# Patient Record
Sex: Female | Born: 1980 | Race: Black or African American | Hispanic: No | Marital: Single | Smoking: Never smoker
Health system: Southern US, Community
[De-identification: ages and names within clinical notes are randomized; demographics above are authoritative.]

## PROBLEM LIST (undated history)

## (undated) DIAGNOSIS — B009 Herpesviral infection, unspecified: Secondary | ICD-10-CM

## (undated) DIAGNOSIS — T7840XA Allergy, unspecified, initial encounter: Secondary | ICD-10-CM

## (undated) DIAGNOSIS — B999 Unspecified infectious disease: Secondary | ICD-10-CM

## (undated) DIAGNOSIS — D219 Benign neoplasm of connective and other soft tissue, unspecified: Secondary | ICD-10-CM

## (undated) HISTORY — DX: Herpesviral infection, unspecified: B00.9

## (undated) HISTORY — DX: Allergy, unspecified, initial encounter: T78.40XA

---
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
## 2001-03-24 ENCOUNTER — Emergency Department (HOSPITAL_COMMUNITY): Admission: EM | Admit: 2001-03-24 | Discharge: 2001-03-24 | Payer: Self-pay | Admitting: *Deleted

## 2001-03-24 ENCOUNTER — Encounter: Payer: Self-pay | Admitting: *Deleted

## 2002-01-02 ENCOUNTER — Encounter: Admission: RE | Admit: 2002-01-02 | Discharge: 2002-01-02 | Payer: Self-pay | Admitting: Internal Medicine

## 2007-06-09 ENCOUNTER — Inpatient Hospital Stay (HOSPITAL_COMMUNITY): Admission: AD | Admit: 2007-06-09 | Discharge: 2007-06-09 | Payer: Self-pay | Admitting: Obstetrics and Gynecology

## 2007-06-16 ENCOUNTER — Ambulatory Visit (HOSPITAL_COMMUNITY): Admission: RE | Admit: 2007-06-16 | Discharge: 2007-06-16 | Payer: Self-pay | Admitting: Family Medicine

## 2007-07-14 ENCOUNTER — Ambulatory Visit (HOSPITAL_COMMUNITY): Admission: RE | Admit: 2007-07-14 | Discharge: 2007-07-14 | Payer: Self-pay | Admitting: Family Medicine

## 2007-07-22 ENCOUNTER — Ambulatory Visit (HOSPITAL_COMMUNITY): Admission: RE | Admit: 2007-07-22 | Discharge: 2007-07-22 | Payer: Self-pay | Admitting: Obstetrics & Gynecology

## 2007-09-01 ENCOUNTER — Ambulatory Visit (HOSPITAL_COMMUNITY): Admission: RE | Admit: 2007-09-01 | Discharge: 2007-09-01 | Payer: Self-pay | Admitting: Obstetrics & Gynecology

## 2007-10-06 ENCOUNTER — Ambulatory Visit (HOSPITAL_COMMUNITY): Admission: RE | Admit: 2007-10-06 | Discharge: 2007-10-06 | Payer: Self-pay | Admitting: Obstetrics & Gynecology

## 2007-11-08 ENCOUNTER — Ambulatory Visit (HOSPITAL_COMMUNITY): Admission: RE | Admit: 2007-11-08 | Discharge: 2007-11-08 | Payer: Self-pay | Admitting: Family Medicine

## 2007-12-08 ENCOUNTER — Ambulatory Visit (HOSPITAL_COMMUNITY): Admission: RE | Admit: 2007-12-08 | Discharge: 2007-12-08 | Payer: Self-pay | Admitting: Obstetrics & Gynecology

## 2007-12-15 ENCOUNTER — Inpatient Hospital Stay (HOSPITAL_COMMUNITY): Admission: AD | Admit: 2007-12-15 | Discharge: 2007-12-19 | Payer: Self-pay | Admitting: Obstetrics & Gynecology

## 2007-12-15 ENCOUNTER — Ambulatory Visit: Payer: Self-pay | Admitting: Physician Assistant

## 2007-12-20 ENCOUNTER — Inpatient Hospital Stay (HOSPITAL_COMMUNITY): Admission: AD | Admit: 2007-12-20 | Discharge: 2007-12-20 | Payer: Self-pay | Admitting: Obstetrics & Gynecology

## 2007-12-20 ENCOUNTER — Ambulatory Visit: Payer: Self-pay | Admitting: Family Medicine

## 2008-02-09 ENCOUNTER — Encounter: Admission: RE | Admit: 2008-02-09 | Discharge: 2008-02-09 | Payer: Self-pay | Admitting: Family Medicine

## 2008-02-17 ENCOUNTER — Encounter: Admission: RE | Admit: 2008-02-17 | Discharge: 2008-02-17 | Payer: Self-pay | Admitting: General Practice

## 2010-03-23 ENCOUNTER — Encounter: Payer: Self-pay | Admitting: Family Medicine

## 2010-03-24 ENCOUNTER — Encounter: Payer: Self-pay | Admitting: Family Medicine

## 2010-07-18 NOTE — Discharge Summary (Signed)
Brittney Mcbride, Brittney Mcbride                ACCOUNT NO.:  [id number]   MEDICAL RECORD NO.:  [id number]          PATIENT TYPE:  INP   LOCATION:  9320                          FACILITY:  [facility]   PHYSICIAN:  Lesly Dukes, M.D. DATE OF BIRTH:  June 16, 1980   DATE OF ADMISSION:  12/15/2007  DATE OF DISCHARGE:  12/19/2007                               DISCHARGE SUMMARY   REASON FOR ADMISSION:  Pregnancy at 38 weeks and 5 days.   Admitted for induction of labor for oligohydramnios.  The patient was  admitted on December 15, 2007, and Foley bulb was placed at that time due  to an unfavorable cervix.  She labored slowly during this course, got  into an active phase of labor actually on December 16, 2007, delivered  vaginally on December 17, 2007, a viable female without any complications.  She progressed postpartum without any complication and was discharged  home on December 19, 2007, without any problem.      Zerita Boers, N.M.      Lesly Dukes, M.D.  Electronically Signed    DL/MEDQ  D:  16/12/9602  T:  01/10/2008  Job:  [id number]

## 2010-08-14 ENCOUNTER — Inpatient Hospital Stay (HOSPITAL_COMMUNITY): Payer: Medicaid Other

## 2010-08-14 ENCOUNTER — Inpatient Hospital Stay (HOSPITAL_COMMUNITY)
Admission: AD | Admit: 2010-08-14 | Discharge: 2010-08-14 | Disposition: A | Discharge status: Home / Self Care | Payer: Medicaid Other | Source: Ambulatory Visit | Attending: Family Medicine | Admitting: Family Medicine

## 2010-08-14 DIAGNOSIS — R1032 Left lower quadrant pain: Secondary | ICD-10-CM

## 2010-08-14 DIAGNOSIS — N390 Urinary tract infection, site not specified: Secondary | ICD-10-CM | POA: Insufficient documentation

## 2010-08-14 DIAGNOSIS — N912 Amenorrhea, unspecified: Secondary | ICD-10-CM | POA: Insufficient documentation

## 2010-08-14 DIAGNOSIS — D259 Leiomyoma of uterus, unspecified: Secondary | ICD-10-CM | POA: Insufficient documentation

## 2010-08-14 LAB — URINE MICROSCOPIC-ADD ON

## 2010-08-14 LAB — URINALYSIS, ROUTINE W REFLEX MICROSCOPIC
Glucose, UA: NEGATIVE mg/dL
Specific Gravity, Urine: 1.025 (ref 1.005–1.030)
pH: 6 (ref 5.0–8.0)

## 2010-08-14 LAB — CBC
HCT: 36.9 % (ref 36.0–46.0)
MCHC: 32 g/dL (ref 30.0–36.0)
MCV: 79.2 fL (ref 78.0–100.0)
RDW: 15.6 % — ABNORMAL HIGH (ref 11.5–15.5)
WBC: 7.2 10*3/uL (ref 4.0–10.5)

## 2010-08-14 LAB — WET PREP, GENITAL: Yeast Wet Prep HPF POC: NONE SEEN

## 2010-08-15 LAB — TSH: TSH: 3.129 u[IU]/mL (ref 0.350–4.500)

## 2010-08-15 LAB — GC/CHLAMYDIA PROBE AMP, GENITAL: Chlamydia, DNA Probe: NEGATIVE

## 2010-11-25 LAB — URINALYSIS, ROUTINE W REFLEX MICROSCOPIC
Bilirubin Urine: NEGATIVE
Nitrite: NEGATIVE
Specific Gravity, Urine: 1.01
pH: 7

## 2010-11-25 LAB — URINE MICROSCOPIC-ADD ON

## 2010-11-25 LAB — URINE CULTURE

## 2010-12-01 LAB — CBC
HCT: 19.5 — ABNORMAL LOW
HCT: 21.5 — ABNORMAL LOW
HCT: 27.4 — ABNORMAL LOW
Hemoglobin: 6.1 — CL
Hemoglobin: 6.7 — CL
Hemoglobin: 8.4 — ABNORMAL LOW
MCHC: 31.1
MCV: 74 — ABNORMAL LOW
MCV: 74.4 — ABNORMAL LOW
Platelets: 158
RBC: 2.61 — ABNORMAL LOW
RBC: 2.9 — ABNORMAL LOW
WBC: 15.2 — ABNORMAL HIGH
WBC: 8.4

## 2012-04-11 ENCOUNTER — Encounter: Payer: Self-pay | Admitting: Family Medicine

## 2012-04-11 ENCOUNTER — Other Ambulatory Visit: Payer: Self-pay | Admitting: Family Medicine

## 2012-04-11 ENCOUNTER — Ambulatory Visit (INDEPENDENT_AMBULATORY_CARE_PROVIDER_SITE_OTHER): Payer: Medicaid Other | Admitting: Family Medicine

## 2012-04-11 VITALS — BP 127/89 | HR 67 | Temp 99.0°F | Ht 65.5 in | Wt 311.0 lb

## 2012-04-11 DIAGNOSIS — R109 Unspecified abdominal pain: Secondary | ICD-10-CM | POA: Insufficient documentation

## 2012-04-11 DIAGNOSIS — A6000 Herpesviral infection of urogenital system, unspecified: Secondary | ICD-10-CM | POA: Insufficient documentation

## 2012-04-11 DIAGNOSIS — R739 Hyperglycemia, unspecified: Secondary | ICD-10-CM | POA: Insufficient documentation

## 2012-04-11 HISTORY — DX: Morbid (severe) obesity due to excess calories: E66.01

## 2012-04-11 HISTORY — DX: Herpesviral infection of urogenital system, unspecified: A60.00

## 2012-04-11 LAB — COMPREHENSIVE METABOLIC PANEL
Albumin: 4.5 g/dL (ref 3.5–5.2)
BUN: 8 mg/dL (ref 6–23)
CO2: 31 mEq/L (ref 19–32)
Glucose, Bld: 108 mg/dL — ABNORMAL HIGH (ref 70–99)
Sodium: 140 mEq/L (ref 135–145)
Total Bilirubin: 0.6 mg/dL (ref 0.3–1.2)
Total Protein: 7.3 g/dL (ref 6.0–8.3)

## 2012-04-11 LAB — CBC WITH DIFFERENTIAL/PLATELET
Basophils Relative: 1 % (ref 0–1)
Eosinophils Relative: 5 % (ref 0–5)
HCT: 36.7 % (ref 36.0–46.0)
Hemoglobin: 11.7 g/dL — ABNORMAL LOW (ref 12.0–15.0)
MCH: 25.2 pg — ABNORMAL LOW (ref 26.0–34.0)
MCHC: 31.9 g/dL (ref 30.0–36.0)
MCV: 78.9 fL (ref 78.0–100.0)
Monocytes Absolute: 0.4 10*3/uL (ref 0.1–1.0)
Monocytes Relative: 6 % (ref 3–12)
Neutro Abs: 2.6 10*3/uL (ref 1.7–7.7)
RDW: 15.2 % (ref 11.5–15.5)

## 2012-04-11 NOTE — Assessment & Plan Note (Addendum)
-  Emberly has a strong desire to be healthy; and is ready to start losing weight with some help. -We have begun setting goals today of 2 pounds a week loss, for the next 6 weeks; achieved by diet plan of 20 minutes x3w; with heart range goal up to 120/m. -Diet and healthy options have been discussed and will be discussed in more detail on next visit.  - AVS given on safe exercise and additional information on healthy food choices.  -Target weight for next appointment is 300.  - F/U: 6 weeks

## 2012-04-11 NOTE — Assessment & Plan Note (Addendum)
-   Currently on Valtrex 500 mg during outbreaks only, per Gyn, with many refills.  - Discussed suppression therapy and benefits vs. Risks - Patient is sexually active with condom use for protection. However, she does desire more children.  - Willing to prescribe Valtrex if needed

## 2012-04-11 NOTE — Patient Instructions (Signed)
It was a pleasure meeting you today. As a team, together we help you loose weight in a healthy manner with diet and exercise.   Exercise to Lose Weight Exercise and a healthy diet may help you lose weight. Your doctor may suggest specific exercises. EXERCISE IDEAS AND TIPS  Choose low-cost things you enjoy doing, such as walking, bicycling, or exercising to workout videos.  Take stairs instead of the elevator.  Walk during your lunch break.  Park your car further away from work or school.  Go to a gym or an exercise class.  Start with 5 to 10 minutes of exercise each day. Build up to 30 minutes of exercise 4 to 6 days a week.  Wear shoes with good support and comfortable clothes.  Stretch before and after working out.  Work out until you breathe harder and your heart beats faster.  Drink extra water when you exercise.  Do not do so much that you hurt yourself, feel dizzy, or get very short of breath. Exercises that burn about 150 calories:  Running 1  miles in 15 minutes.  Playing volleyball for 45 to 60 minutes.  Washing and waxing a car for 45 to 60 minutes.  Playing touch football for 45 minutes.  Walking 1  miles in 35 minutes.  Pushing a stroller 1  miles in 30 minutes.  Playing basketball for 30 minutes.  Raking leaves for 30 minutes.  Bicycling 5 miles in 30 minutes.  Walking 2 miles in 30 minutes.  Dancing for 30 minutes.  Shoveling snow for 15 minutes.  Swimming laps for 20 minutes.  Walking up stairs for 15 minutes.  Bicycling 4 miles in 15 minutes.  Gardening for 30 to 45 minutes.  Jumping rope for 15 minutes.  Washing windows or floors for 45 to 60 minutes. Document Released: 03/21/2010 Document Revised: 05/11/2011 Document Reviewed: 03/21/2010 [facility] Patient Information 2013 [place], [place].

## 2012-04-11 NOTE — Progress Notes (Signed)
Subjective:     Patient ID: Brittney Mcbride, female   DOB: Jun 13, 1980, 32 y.o.   MRN: [id number]  HPI New patient: Patient is here to establish care, by suggestion of her GYN, after an elevated lipid profile. Medications, allergies, PMH, surgical, social and family history.    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 She currently takes Valtrex for outbreaks only. She reports she has less than two outbreaks a year. Her gynecologists has prescribed her with refills. She has no current outbreak. She currently is not on birth control medication. She endorses she practices safe sex with condoms. She desires more children. LMP: 03/31/2012. Cycle every 32 days, tracks is phone app, 4-6d in length, heavy on 2-3d. Admits to severe abdominal pain and cramping during her menstrual cycle.  NSVD viable female @ 39w (unknown year).  Weight Loss:  Patients main concern today is weight loss. She reports she wants to lose weight and has started, by attempting to walk more often and choosing healthier food options. She wants to be a better example for school age son. She reports she lost about 30 pounds since last year this time. She reports mild lower back pain she feels is from her weight. She reports diabetes runs her family and she feel she is experiencing signs of possible diabetes. She feels she can loose weight with some help.   Review of Systems  Constitutional: Negative for fever and chills.  Endocrine: Positive for polydipsia and polyuria.  Genitourinary: Negative for urgency, genital sores and pelvic pain.       Objective:   Physical Exam  Nursing note and vitals reviewed. Constitutional: She is oriented to person, place, and time. She appears well-developed and well-nourished. No distress.  Obese. Extremely pleasant.  HENT:  Head: Normocephalic and atraumatic.  Right Ear: External ear normal.  Left Ear: External ear normal.  Nose: Nose normal.   Mouth/Throat: Oropharynx is clear and moist. No oropharyngeal exudate.  Eyes: Conjunctivae and EOM are normal. Pupils are equal, round, and reactive to light. Right eye exhibits no discharge. Left eye exhibits no discharge. No scleral icterus.  Neck: Normal range of motion. Neck supple. No thyromegaly present.  Cardiovascular: Normal rate and regular rhythm.   Pulmonary/Chest: Effort normal and breath sounds normal.  Abdominal: Soft. Bowel sounds are normal. She exhibits no mass. There is no hepatosplenomegaly. There is tenderness in the left upper quadrant. There is no rigidity, no rebound, no guarding, no CVA tenderness, no tenderness at McBurney's point and negative Murphy's sign.  Obese.  Musculoskeletal: She exhibits no edema and no tenderness.  Lymphadenopathy:    She has no cervical adenopathy.  Neurological: She is alert and oriented to person, place, and time. She has normal reflexes.  Skin: Skin is warm and dry.  Psychiatric: She has a normal mood and affect.    BP 127/89  Pulse 67  Temp(Src) 99 F (37.2 C) (Oral)  Ht 5' 5.5" (1.664 m)  Wt 311 lb (141.069 kg)  BMI 50.95 kg/m2  LMP 03/31/2012

## 2012-04-11 NOTE — Assessment & Plan Note (Signed)
-   Obese patient with an elevated glucose on CBC, with symptoms of polydipsia and polyuria. - Future order of UA, FBG, and A1c added to lipid profile future order. - lifestyle changes are being implemented - F/U: pending results

## 2012-04-11 NOTE — Assessment & Plan Note (Signed)
-   On exam patient: TTP LUQ--> suprapubic. Otherwise, no other complaints.   - Baseline labs completed today CBC, CMP, TSH; Lipid profile (fasting) placed for future order to be completed within two weeks.   - Etiology, nonspecific. Her son was sent home today from school with GI complaints.   -  Possible viral etiology. Instructed to follow-up in one week if pain worsens, changes in GI pattern or fever. - Considering possible imaging studies if condition worsens.

## 2012-04-19 ENCOUNTER — Other Ambulatory Visit: Payer: Medicaid Other

## 2012-04-19 LAB — LIPID PANEL
Cholesterol: 137 mg/dL (ref 0–200)
HDL: 31 mg/dL — ABNORMAL LOW (ref >39)
Triglycerides: 178 mg/dL — ABNORMAL HIGH (ref <150)
VLDL: 36 mg/dL (ref 0–40)

## 2012-04-19 NOTE — Progress Notes (Signed)
FLP DONE TODAY Brittney Mcbride 

## 2012-04-25 ENCOUNTER — Encounter: Payer: Self-pay | Admitting: Family Medicine

## 2012-06-28 ENCOUNTER — Encounter: Payer: Self-pay | Admitting: Obstetrics

## 2012-06-29 ENCOUNTER — Ambulatory Visit (INDEPENDENT_AMBULATORY_CARE_PROVIDER_SITE_OTHER): Payer: Medicaid Other | Admitting: Obstetrics

## 2012-06-29 ENCOUNTER — Encounter: Payer: Self-pay | Admitting: Obstetrics

## 2012-06-29 ENCOUNTER — Ambulatory Visit (HOSPITAL_COMMUNITY): Admission: RE | Admit: 2012-06-29 | Payer: Medicaid Other | Source: Ambulatory Visit

## 2012-06-29 VITALS — BP 139/98 | HR 71 | Temp 98.7°F

## 2012-06-29 DIAGNOSIS — Z3202 Encounter for pregnancy test, result negative: Secondary | ICD-10-CM

## 2012-06-29 DIAGNOSIS — N946 Dysmenorrhea, unspecified: Secondary | ICD-10-CM

## 2012-06-29 DIAGNOSIS — D259 Leiomyoma of uterus, unspecified: Secondary | ICD-10-CM

## 2012-06-29 DIAGNOSIS — N920 Excessive and frequent menstruation with regular cycle: Secondary | ICD-10-CM

## 2012-06-29 LAB — COMPREHENSIVE METABOLIC PANEL
BUN: 5 mg/dL — ABNORMAL LOW (ref 6–23)
CO2: 28 mEq/L (ref 19–32)
Creat: 0.97 mg/dL (ref 0.50–1.10)
Glucose, Bld: 87 mg/dL (ref 70–99)
Total Bilirubin: 0.6 mg/dL (ref 0.3–1.2)
Total Protein: 7.3 g/dL (ref 6.0–8.3)

## 2012-06-29 LAB — DIFFERENTIAL
Eosinophils Absolute: 0.4 10*3/uL (ref 0.0–0.7)
Eosinophils Relative: 6 % — ABNORMAL HIGH (ref 0–5)
Lymphs Abs: 3 10*3/uL (ref 0.7–4.0)
Monocytes Relative: 5 % (ref 3–12)

## 2012-06-29 LAB — CBC
Hemoglobin: 11.3 g/dL — ABNORMAL LOW (ref 12.0–15.0)
MCH: 25.8 pg — ABNORMAL LOW (ref 26.0–34.0)
MCV: 81.3 fL (ref 78.0–100.0)
RBC: 4.38 MIL/uL (ref 3.87–5.11)

## 2012-06-29 NOTE — Patient Instructions (Addendum)
Fibroids

## 2012-06-29 NOTE — Progress Notes (Signed)
.   Subjective:     Brittney Mcbride is a 32 y.o. female here for a problem visit.  Current complaints: severe cramps during menstrual cycle.  Patient would like to discuss a hysterectomy.  Personal health questionnaire reviewed: yes.   Gynecologic History Patient's last menstrual period was 06/26/2012. Contraception: none Last Pap: 03/2012. Results were: normal Last mammogram: N/A   The following portions of the patient's history were reviewed and updated as appropriate: allergies, current medications, past family history, past medical history, past social history, past surgical history and problem list.  Review of Systems Pertinent items are noted in HPI.    Objective:    General appearance: alert and no distress Abdomen: normal findings: soft, non-tender Pelvic: cervix normal in appearance, external genitalia normal, no adnexal masses or tenderness, no cervical motion tenderness, uterus normal size, shape, and consistency and vagina normal without discharge    Assessment:    Healthy female exam.  Severe dysmenorrhea   Plan:    Education reviewed: Dysmenorrhea.   Ultrasound ordered.

## 2012-06-30 ENCOUNTER — Ambulatory Visit (HOSPITAL_COMMUNITY)
Admission: RE | Admit: 2012-06-30 | Discharge: 2012-06-30 | Disposition: A | Discharge status: Home / Self Care | Payer: Medicaid Other | Source: Ambulatory Visit | Attending: Obstetrics | Admitting: Obstetrics

## 2012-06-30 ENCOUNTER — Encounter: Payer: Self-pay | Admitting: Obstetrics

## 2012-06-30 ENCOUNTER — Ambulatory Visit (INDEPENDENT_AMBULATORY_CARE_PROVIDER_SITE_OTHER): Payer: Medicaid Other | Admitting: Obstetrics

## 2012-06-30 ENCOUNTER — Other Ambulatory Visit: Payer: Self-pay | Admitting: Obstetrics

## 2012-06-30 VITALS — BP 140/94 | HR 69 | Temp 97.9°F | Wt 304.0 lb

## 2012-06-30 DIAGNOSIS — R1032 Left lower quadrant pain: Secondary | ICD-10-CM

## 2012-06-30 DIAGNOSIS — D259 Leiomyoma of uterus, unspecified: Secondary | ICD-10-CM

## 2012-06-30 DIAGNOSIS — N938 Other specified abnormal uterine and vaginal bleeding: Secondary | ICD-10-CM | POA: Insufficient documentation

## 2012-06-30 DIAGNOSIS — N946 Dysmenorrhea, unspecified: Secondary | ICD-10-CM

## 2012-06-30 DIAGNOSIS — D250 Submucous leiomyoma of uterus: Secondary | ICD-10-CM | POA: Insufficient documentation

## 2012-06-30 DIAGNOSIS — N949 Unspecified condition associated with female genital organs and menstrual cycle: Secondary | ICD-10-CM | POA: Insufficient documentation

## 2012-06-30 DIAGNOSIS — N920 Excessive and frequent menstruation with regular cycle: Secondary | ICD-10-CM

## 2012-06-30 LAB — TSH: TSH: 1.503 u[IU]/mL (ref 0.350–4.500)

## 2012-06-30 MED ORDER — TRAMADOL HCL 50 MG PO TABS: 50.0000 mg | ORAL_TABLET | Freq: Four times a day (QID) | ORAL | Status: DC | PRN

## 2012-06-30 MED ORDER — PRENATAL PLUS IRON 29-1 MG PO TABS: 1.0000 | ORAL_TABLET | Freq: Every day | ORAL | Status: DC

## 2012-06-30 NOTE — Progress Notes (Signed)
Subjective:     Brittney Mcbride is a 32 y.o. female here for follow up after ultrasound done today.  Current complaints: none.  Personal health questionnaire reviewed: not asked.   Gynecologic History Patient's last menstrual period was 06/26/2012.  The following portions of the patient's history were reviewed and updated as appropriate: allergies, current medications, past family history, past medical history, past social history, past surgical history and problem list.  Review of Systems Pertinent items are noted in HPI.    Objective:     No exam.  Consult only.     Assessment:    Uterine Fibroids  Dysmenorrhea  Menorrhagia      Plan:  Education reviewed: Management of:  Fibroids.                                                               Dysmenorrhea                                                               Menorhagia   F/U in 6 months

## 2012-06-30 NOTE — Patient Instructions (Addendum)
Fibroids Dysmenorrhea Menorrhagia Ablation

## 2012-07-01 ENCOUNTER — Ambulatory Visit (HOSPITAL_COMMUNITY): Payer: Medicaid Other

## 2013-01-11 ENCOUNTER — Ambulatory Visit: Payer: Medicaid Other | Admitting: Obstetrics

## 2013-01-12 ENCOUNTER — Ambulatory Visit (INDEPENDENT_AMBULATORY_CARE_PROVIDER_SITE_OTHER): Payer: Medicaid Other | Admitting: Obstetrics

## 2013-01-12 ENCOUNTER — Encounter: Payer: Self-pay | Admitting: Obstetrics

## 2013-01-12 VITALS — BP 121/86 | HR 65 | Temp 98.8°F | Ht 64.0 in | Wt 297.0 lb

## 2013-01-12 DIAGNOSIS — Z202 Contact with and (suspected) exposure to infections with a predominantly sexual mode of transmission: Secondary | ICD-10-CM | POA: Insufficient documentation

## 2013-01-12 DIAGNOSIS — N760 Acute vaginitis: Secondary | ICD-10-CM | POA: Insufficient documentation

## 2013-01-12 DIAGNOSIS — Z113 Encounter for screening for infections with a predominantly sexual mode of transmission: Secondary | ICD-10-CM

## 2013-01-12 DIAGNOSIS — Z2089 Contact with and (suspected) exposure to other communicable diseases: Secondary | ICD-10-CM

## 2013-01-12 NOTE — Progress Notes (Signed)
Subjective:     Brittney Mcbride is a 32 y.o. female here for a follow up exam.  Current complaints: persistent LLQ pain, worse over the past year.  Personal health questionnaire reviewed: yes.   Gynecologic History Patient's last menstrual period was 01/04/2013. Contraception: none Last Pap: January/2014. Results were: normal Last mammogram: December 2009. Results were: normal  Obstetric History OB History  No data available     The following portions of the patient's history were reviewed and updated as appropriate: allergies, current medications, past family history, past medical history, past social history, past surgical history and problem list.  Review of Systems Pertinent items are noted in HPI.    Objective:    General appearance: alert and no distress Abdomen: normal findings: soft, non-tender Pelvic: cervix normal in appearance, external genitalia normal, no adnexal masses or tenderness, no cervical motion tenderness, rectovaginal septum normal, uterus normal size, shape, and consistency and vagina normal without discharge    Assessment:    Healthy female exam.   H/O possible STD exposure.   Plan:    Education reviewed: safe sex/STD prevention. Follow up in: several months.

## 2013-01-13 LAB — WET PREP BY MOLECULAR PROBE
Candida species: NEGATIVE
Trichomonas vaginosis: NEGATIVE

## 2013-01-13 LAB — GC/CHLAMYDIA PROBE AMP: CT Probe RNA: NEGATIVE

## 2013-01-15 ENCOUNTER — Encounter (HOSPITAL_COMMUNITY): Payer: Self-pay | Admitting: *Deleted

## 2013-01-15 ENCOUNTER — Inpatient Hospital Stay (HOSPITAL_COMMUNITY)
Admission: AD | Admit: 2013-01-15 | Discharge: 2013-01-15 | Disposition: A | Discharge status: Home / Self Care | Payer: Medicaid Other | Source: Ambulatory Visit | Attending: Obstetrics | Admitting: Obstetrics

## 2013-01-15 DIAGNOSIS — N949 Unspecified condition associated with female genital organs and menstrual cycle: Secondary | ICD-10-CM

## 2013-01-15 DIAGNOSIS — B9689 Other specified bacterial agents as the cause of diseases classified elsewhere: Secondary | ICD-10-CM | POA: Insufficient documentation

## 2013-01-15 DIAGNOSIS — R102 Pelvic and perineal pain: Secondary | ICD-10-CM

## 2013-01-15 DIAGNOSIS — N760 Acute vaginitis: Secondary | ICD-10-CM | POA: Insufficient documentation

## 2013-01-15 DIAGNOSIS — A499 Bacterial infection, unspecified: Secondary | ICD-10-CM | POA: Insufficient documentation

## 2013-01-15 HISTORY — DX: Benign neoplasm of connective and other soft tissue, unspecified: D21.9

## 2013-01-15 HISTORY — DX: Unspecified infectious disease: B99.9

## 2013-01-15 LAB — WET PREP, GENITAL: Trich, Wet Prep: NONE SEEN

## 2013-01-15 LAB — URINE MICROSCOPIC-ADD ON

## 2013-01-15 LAB — URINALYSIS, ROUTINE W REFLEX MICROSCOPIC
Bilirubin Urine: NEGATIVE
Specific Gravity, Urine: 1.025 (ref 1.005–1.030)
Urobilinogen, UA: 1 mg/dL (ref 0.0–1.0)

## 2013-01-15 MED ORDER — OXYCODONE-ACETAMINOPHEN 5-325 MG PO TABS: 2.0000 | ORAL_TABLET | ORAL | Status: DC | PRN

## 2013-01-15 MED ORDER — METRONIDAZOLE 500 MG PO TABS: 500.0000 mg | ORAL_TABLET | Freq: Two times a day (BID) | ORAL | Status: DC

## 2013-01-15 MED ORDER — CEPHALEXIN 500 MG PO CAPS: 500.0000 mg | ORAL_CAPSULE | Freq: Two times a day (BID) | ORAL | Status: DC

## 2013-01-15 MED ORDER — OXYCODONE-ACETAMINOPHEN 5-325 MG PO TABS: 2.0000 | ORAL_TABLET | Freq: Once | ORAL | Status: DC

## 2013-01-15 MED ORDER — KETOROLAC TROMETHAMINE 60 MG/2ML IM SOLN
60.0000 mg | Freq: Once | INTRAMUSCULAR | Status: AC
  Administered 2013-01-15: 60 mg via INTRAMUSCULAR
  Filled 2013-01-15: qty 2

## 2013-01-15 NOTE — MAU Note (Signed)
Went to dr Thursday, for 41month checkup, fibroids.  Has been having a lot more discomfort since exam, not normal and pain has gotten worse.. Now saw weird d/c when used restroom

## 2013-01-15 NOTE — MAU Provider Note (Signed)
History     CSN: [id number]  Arrival date and time: 01/15/13 1317   First Provider Initiated Contact with Patient 01/15/13 1407      Chief Complaint  Patient presents with  . vaginal soreness    HPI  Pt is not pregnant and was seen at Dr. Verdell Carmine office on 01/12/2013 Magdalene Molly) for exam.  Pt states after  Her exam with Dr. Clearance Coots, pt started having intense vaginal pain- hurts to sit  Pt took Advil last night without relief; pt also has noticed some clumpy, stringy vaginal discharge. Pt had AFFIRM in office which showed pos Gardnerella but pt has not been notified- GC/Chlamdyia were neg Pt denies dysuria. Pt has hx of HSV but only takes episodically- hasan't taken in several weeks.  This does not feel Like HSV.  Past Medical History  Diagnosis Date  . HSV-2 (herpes simplex virus 2) infection   . Allergy   . Fibroid   . Infection     UTI    Past Surgical History  Procedure Laterality Date  . Wisdom tooth extraction  2000    Family History  Problem Relation Age of Onset  . Diabetes Father   . Asthma Brother   . Diabetes Brother   . Early death Brother   . Drug abuse Maternal Aunt   . Drug abuse Paternal Uncle   . Alcohol abuse Maternal Grandmother   . Asthma Maternal Grandmother   . Alzheimer's disease Maternal Grandmother   . Alcohol abuse Maternal Grandfather   . Asthma Maternal Grandfather   . Alcohol abuse Paternal Grandmother   . Diabetes Paternal Grandmother   . Alcohol abuse Paternal Grandfather     History  Substance Use Topics  . Smoking status: Never Smoker   . Smokeless tobacco: Never Used  . Alcohol Use: No    Allergies:  Allergies  Allergen Reactions  . Latex Itching    Prescriptions prior to admission  Medication Sig Dispense Refill  . ibuprofen (ADVIL,MOTRIN) 800 MG tablet Take 800 mg by mouth every 8 (eight) hours as needed for moderate pain.      . valACYclovir (VALTREX) 500 MG tablet Take 1,000 mg by mouth 2 (two) times daily as  needed (during outbreaks).         Review of Systems  Constitutional: Negative for fever and chills.  Gastrointestinal: Negative for nausea, vomiting, abdominal pain, diarrhea and constipation.  Genitourinary: Negative for dysuria and urgency.   Physical Exam   Blood pressure 126/85, pulse 103, temperature 98.9 F (37.2 C), temperature source Oral, resp. rate 20, height 5\' 5"  (1.651 m), weight 130.182 kg (287 lb), last menstrual period 01/04/2013.  Physical Exam  Nursing note and vitals reviewed. Constitutional: She is oriented to person, place, and time. She appears well-developed and well-nourished. No distress.  HENT:  Head: Normocephalic.  Eyes: Pupils are equal, round, and reactive to light.  Neck: Normal range of motion. Neck supple.  Cardiovascular: Normal rate.   Respiratory: Effort normal.  GI: Soft.  Genitourinary:  Right labia edematous and exquisitely tender to touch-no lesions or fissures noted; speculum and bimanual exam compromised by pt's discomfort  Musculoskeletal: Normal range of motion.  Neurological: She is alert and oriented to person, place, and time.  Skin: Skin is warm and dry.  Psychiatric: She has a normal mood and affect.    MAU Course  Procedures Results for orders placed during the hospital encounter of 01/15/13 (from the past 24 hour(s))  URINALYSIS, ROUTINE W REFLEX MICROSCOPIC  Status: Abnormal   Collection Time    01/15/13  1:35 PM      Result Value Range   Color, Urine YELLOW  YELLOW   APPearance CLOUDY (*) CLEAR   Specific Gravity, Urine 1.025  1.005 - 1.030   pH 8.0  5.0 - 8.0   Glucose, UA NEGATIVE  NEGATIVE mg/dL   Hgb urine dipstick LARGE (*) NEGATIVE   Bilirubin Urine NEGATIVE  NEGATIVE   Ketones, ur NEGATIVE  NEGATIVE mg/dL   Protein, ur 161 (*) NEGATIVE mg/dL   Urobilinogen, UA 1.0  0.0 - 1.0 mg/dL   Nitrite POSITIVE (*) NEGATIVE   Leukocytes, UA MODERATE (*) NEGATIVE  URINE MICROSCOPIC-ADD ON     Status: Abnormal    Collection Time    01/15/13  1:35 PM      Result Value Range   Squamous Epithelial / LPF MANY (*) RARE   WBC, UA TOO NUMEROUS TO COUNT  <3 WBC/hpf   RBC / HPF 11-20  <3 RBC/hpf   Bacteria, UA MANY (*) RARE   Urine-Other MUCOUS PRESENT    WET PREP, GENITAL     Status: Abnormal   Collection Time    01/15/13  2:15 PM      Result Value Range   Yeast Wet Prep HPF POC NONE SEEN  NONE SEEN   Trich, Wet Prep NONE SEEN  NONE SEEN   Clue Cells Wet Prep HPF POC MODERATE (*) NONE SEEN   WBC, Wet Prep HPF POC MODERATE (*) NONE SEEN  POCT PREGNANCY, URINE     Status: None   Collection Time    01/15/13  2:30 PM      Result Value Range   Preg Test, Ur NEGATIVE  NEGATIVE   Discussed with Dr. Gaynell Face- will treat with Keflex with suspicion of Bartholin's  Warm sitz baths and Percocet for pain   Assessment and Plan  Vaginal pain -suspicious for Bartholin's - Keflex 500 mg BID for 10 days Percocet for pain Warm sitz baths BV- Flagyl 500mg  BID for 7 days Recommended pt to take her Valtrex Pt to f/u with Dr. Clearance Coots tomorrow  Pamelia Hoit 01/15/2013, 2:42 PM

## 2013-01-17 LAB — URINE CULTURE: Culture: >=100000

## 2013-01-24 ENCOUNTER — Ambulatory Visit: Payer: Medicaid Other | Admitting: Obstetrics

## 2013-01-25 ENCOUNTER — Encounter: Payer: Self-pay | Admitting: Obstetrics

## 2013-01-25 ENCOUNTER — Ambulatory Visit (INDEPENDENT_AMBULATORY_CARE_PROVIDER_SITE_OTHER): Payer: Medicaid Other | Admitting: Obstetrics

## 2013-01-25 VITALS — BP 114/77 | HR 75 | Temp 98.4°F | Ht 64.0 in | Wt 293.0 lb

## 2013-01-25 DIAGNOSIS — N751 Abscess of Bartholin's gland: Secondary | ICD-10-CM | POA: Insufficient documentation

## 2013-01-25 DIAGNOSIS — N760 Acute vaginitis: Secondary | ICD-10-CM

## 2013-01-25 HISTORY — DX: Abscess of Bartholin's gland: N75.1

## 2013-01-25 NOTE — Progress Notes (Signed)
Subjective:     Brittney Mcbride is a 32 y.o. female here for a follow up exam.  Current complaints  .thinks she has a yeast infection, denies pain or pressure from cyst  Personal health questionnaire reviewed: yes.   Gynecologic History Patient's last menstrual period was 01/04/2013. Contraception: condoms Last Pap: 03/2012. Results were: normal Last mammogram: 01/2008. Result were: normal  Obstetric History OB History  Gravida Para Term Preterm AB SAB TAB Ectopic Multiple Living  1 1 1  0 0 0 0 0 0 1    # Outcome Date GA Lbr Len/2nd Weight Sex Delivery Anes PTL Lv  1 TRM     M SVD EPI N Y       The following portions of the patient's history were reviewed and updated as appropriate: allergies, current medications, past family history, past medical history, past social history, past surgical history and problem list.  Review of Systems Pertinent items are noted in HPI.    Objective:    General appearance: alert and no distress Pelvic: no adnexal masses or tenderness, uterus normal size, shape, and consistency and vagina normal without discharge   Vulva: Cyst well healed, NT.  Assessment:    Bartholin Gland Cyst.  Resolved.   Plan:    Education reviewed: safe sex/STD prevention and management of Bartholin gland infections. Follow up in: several months.

## 2013-01-26 LAB — WET PREP BY MOLECULAR PROBE: Candida species: POSITIVE — AB

## 2013-01-31 ENCOUNTER — Other Ambulatory Visit: Payer: Self-pay | Admitting: *Deleted

## 2013-01-31 DIAGNOSIS — N390 Urinary tract infection, site not specified: Secondary | ICD-10-CM

## 2013-01-31 DIAGNOSIS — B379 Candidiasis, unspecified: Secondary | ICD-10-CM

## 2013-01-31 DIAGNOSIS — B9689 Other specified bacterial agents as the cause of diseases classified elsewhere: Secondary | ICD-10-CM

## 2013-01-31 MED ORDER — METRONIDAZOLE 500 MG PO TABS: 500.0000 mg | ORAL_TABLET | Freq: Two times a day (BID) | ORAL | Status: DC

## 2013-01-31 MED ORDER — FLUCONAZOLE 150 MG PO TABS: 150.0000 mg | ORAL_TABLET | Freq: Once | ORAL | Status: DC

## 2013-01-31 MED ORDER — NITROFURANTOIN MONOHYD MACRO 100 MG PO CAPS: 100.0000 mg | ORAL_CAPSULE | Freq: Two times a day (BID) | ORAL | Status: DC

## 2013-03-10 ENCOUNTER — Encounter: Payer: Self-pay | Admitting: Family Medicine

## 2013-03-10 ENCOUNTER — Ambulatory Visit (INDEPENDENT_AMBULATORY_CARE_PROVIDER_SITE_OTHER): Payer: Medicaid Other | Admitting: Family Medicine

## 2013-03-10 VITALS — BP 139/111 | HR 66 | Temp 99.4°F | Ht 64.0 in | Wt 296.0 lb

## 2013-03-10 DIAGNOSIS — M722 Plantar fascial fibromatosis: Secondary | ICD-10-CM

## 2013-03-10 DIAGNOSIS — E781 Pure hyperglyceridemia: Secondary | ICD-10-CM

## 2013-03-10 HISTORY — DX: Plantar fascial fibromatosis: M72.2

## 2013-03-10 HISTORY — DX: Pure hyperglyceridemia: E78.1

## 2013-03-10 NOTE — Patient Instructions (Signed)
Triglycerides, TG, TRIG This is a test to check your risk of developing heart disease. It is often done as part of a lipid profile during a regular medical exam or if you are being treated for high triglycerides. This test measures the amount of triglycerides in your blood. Triglycerides are the body's storage form for fat. Most triglycerides are found in fat tissue. Some triglycerides circulate in the blood to provide fuel for muscles to work. Extra triglycerides are found in the blood after eating a meal when fat is being sent from the gut to fat tissue for storage. The test for triglycerides should be done when you are fasting and no extra triglycerides from a recent meal are present.  SAMPLE COLLECTION The test for triglycerides uses a blood sample. Most often, the blood sample is collected using a needle to collect blood from a vein. Sometimes triglycerides are measured using a drop of blood collected by puncturing the skin on a finger. Testing should be done when you are fasting. For 12 to 14 hours before the test, only water is permitted. In addition, alcohol should not be consumed for the 24 hours just before the test. If you are diabetic and your blood sugar is out of control, triglycerides will be very high. NORMAL FINDINGS  Adult/elderly  Female: 40-160 mg/dL or 0.45-1.81 mmol/L (SI units)  Female: 35-135 mg/dL or 0.40-1.52 mmol/L (SI units)  0-32 years  Female: 30-86 mg/dL  Female: 32-99 mg/dL  6-32 years  Female: 31-108 mg/dL  Female: 35-114 mg/dL  12-32 years  Female: 36-138 mg/dL  Female: 41-138 mg/dL  16-32 years  Female: 40-163 mg/dL  Female: 40-128 mg/dL Ranges for normal findings may vary among different laboratories and hospitals. You should always check with your doctor after having lab work or other tests done to discuss the meaning of your test results and whether your values are considered within normal limits. MEANING OF TEST  Your caregiver will go over the test  results with you and discuss the importance and meaning of your results, as well as treatment options and the need for additional tests if necessary. OBTAINING THE TEST RESULTS It is your responsibility to obtain your test results. Ask the lab or department performing the test when and how you will get your results. Document Released: 03/21/2004 Document Revised: 05/11/2011 Document Reviewed: 01/29/2008 [facility] Patient Information 2014 [place], [place].   Try to take 1000 mg of fish oil, over the counter, at night before bed. This may help in lowering your cholesterol. Also eat a high fiber diet (veggies and fruits) will help.    Plantar Fasciitis Plantar fasciitis is a common condition that causes foot pain. It is soreness (inflammation) of the band of tough fibrous tissue on the bottom of the foot that runs from the heel bone (calcaneus) to the ball of the foot. The cause of this soreness may be from excessive standing, poor fitting shoes, running on hard surfaces, being overweight, having an abnormal walk, or overuse (this is common in runners) of the painful foot or feet. It is also common in aerobic exercise dancers and ballet dancers. SYMPTOMS  Most people with plantar fasciitis complain of:  Severe pain in the morning on the bottom of their foot especially when taking the first steps out of bed. This pain recedes after a few minutes of walking.  Severe pain is experienced also during walking following a long period of inactivity.  Pain is worse when walking barefoot or up stairs DIAGNOSIS  Your caregiver will diagnose this condition by examining and feeling your foot.  Special tests such as X-rays of your foot, are usually not needed. PREVENTION   Consult a sports medicine professional before beginning a new exercise program.  Walking programs offer a good workout. With walking there is a lower chance of overuse injuries common to runners. There is less impact and less jarring of  the joints.  Begin all new exercise programs slowly. If problems or pain develop, decrease the amount of time or distance until you are at a comfortable level.  Wear good shoes and replace them regularly.  Stretch your foot and the heel cords at the back of the ankle (Achilles tendon) both before and after exercise.  Run or exercise on even surfaces that are not hard. For example, asphalt is better than pavement.  Do not run barefoot on hard surfaces.  If using a treadmill, vary the incline.  Do not continue to workout if you have foot or joint problems. Seek professional help if they do not improve. HOME CARE INSTRUCTIONS   Avoid activities that cause you pain until you recover.  Use ice or cold packs on the problem or painful areas after working out.  Only take over-the-counter or prescription medicines for pain, discomfort, or fever as directed by your caregiver.  Soft shoe inserts or athletic shoes with air or gel sole cushions may be helpful.  If problems continue or become more severe, consult a sports medicine caregiver or your own health care provider. Cortisone is a potent anti-inflammatory medication that may be injected into the painful area. You can discuss this treatment with your caregiver. MAKE SURE YOU:   Understand these instructions.  Will watch your condition.  Will get help right away if you are not doing well or get worse. Document Released: 11/11/2000 Document Revised: 05/11/2011 Document Reviewed: 01/11/2008 [facility] Patient Information 2014 [place], [place].

## 2013-03-10 NOTE — Assessment & Plan Note (Signed)
Ordered fasting lipids for future order today. Patient is agreeable to make lab appointment on a day she can fast the night  Prior.  Discussed the use of fish oil supplements. Her last Tg was only mildly elevated. Discussed dietary changes Will discuss further management after results are available.

## 2013-03-10 NOTE — Progress Notes (Signed)
   Subjective:    Patient ID: Brittney Mcbride, female    DOB: 01-26-1981, 33 y.o.   MRN: [id number]  HPI  Hypertriglyceridemia :Pt was originally referred to the clinic to follow up on "high cholesterol." In review of her labs in the past her triglycerides were mildly elevated. Otherwise her cholesterol was good. Patient has not repeated her fasting lipid panel as of yet. She is currently not taking medications for cholesterol.    Plantar fascitis: Patient reports over the last few weeks the bottom of both of her feet have started to "burn." She is wearing a new pair of work boots today, that do not appear to be made well or have arch support. She does not use shoe inserts. She has never had this problem prior. She endorses improvement with ambulation. Worse in the morning or after rest. She has not attempted to take any medications. She has been trying to stretch them out and that seems to have helped. She denies open sores, redness or swelling in her foot.   Review of Systems Negative, with the exception of above mentioned in HPI     Objective:   Physical Exam BP 139/111  Pulse 66  Temp(Src) 99.4 F (37.4 C) (Oral)  Ht 5\' 4"  (1.626 m)  Wt 296 lb (134.265 kg)  BMI 50.78 kg/m2  LMP 03/04/2013 Gen: NAD. Pleasant.  CV: RRR  Chest: CTAB, no wheeze or crackles Ext: No erythema. No edema. Good perfusion to bilateral feet. No open lesions or sores. Equal +2/4 pulses bilaterally. Not TTP. Currently not painful.

## 2013-03-10 NOTE — Assessment & Plan Note (Signed)
-   AVS on plantar fascitis given - Advised and demonstrated stretches for patient to perform daily. - advised use of NSAIDS if necessary, if she has no problems with kidneys or allergies.  - Advised pt to consider getting shoe inserts and/or making certain her shoes have good arch support.  - PRN f/u

## 2013-03-29 ENCOUNTER — Encounter: Payer: Self-pay | Admitting: Obstetrics

## 2013-03-29 ENCOUNTER — Ambulatory Visit: Payer: Medicaid Other | Admitting: Obstetrics

## 2013-03-29 ENCOUNTER — Ambulatory Visit (INDEPENDENT_AMBULATORY_CARE_PROVIDER_SITE_OTHER): Payer: Medicaid Other | Admitting: Obstetrics

## 2013-03-29 VITALS — BP 130/84 | HR 88 | Temp 98.3°F | Ht 64.5 in | Wt 290.0 lb

## 2013-03-29 DIAGNOSIS — Z01419 Encounter for gynecological examination (general) (routine) without abnormal findings: Secondary | ICD-10-CM

## 2013-03-29 DIAGNOSIS — Z113 Encounter for screening for infections with a predominantly sexual mode of transmission: Secondary | ICD-10-CM

## 2013-03-29 DIAGNOSIS — Z0000 Encounter for general adult medical examination without abnormal findings: Secondary | ICD-10-CM

## 2013-03-29 LAB — POCT URINALYSIS DIPSTICK
BILIRUBIN UA: NEGATIVE
Blood, UA: NEGATIVE
GLUCOSE UA: NEGATIVE
NITRITE UA: NEGATIVE
Spec Grav, UA: 1.025
Urobilinogen, UA: NEGATIVE
pH, UA: 5

## 2013-03-29 LAB — WET PREP BY MOLECULAR PROBE
Candida species: NEGATIVE
Gardnerella vaginalis: NEGATIVE
Trichomonas vaginosis: NEGATIVE

## 2013-03-29 NOTE — Progress Notes (Signed)
Subjective:     Brittney Mcbride is a 33 y.o. female here for a routine annual exam.  Current complaints: pt has no complaints today.  Personal health questionnaire reviewed: yes.   Gynecologic History Patient's last menstrual period was 03/04/2013. Contraception: condoms Last Pap: 03/2012. Results were: normal Last mammogram: n/a  Obstetric History OB History  Gravida Para Term Preterm AB SAB TAB Ectopic Multiple Living  1 1 1  0 0 0 0 0 0 1    # Outcome Date GA Lbr Len/2nd Weight Sex Delivery Anes PTL Lv  1 TRM     M SVD EPI N Y       The following portions of the patient's history were reviewed and updated as appropriate: allergies, current medications, past family history, past medical history, past social history, past surgical history and problem list.  Review of Systems Pertinent items are noted in HPI.    Objective:    General appearance: alert and no distress Breasts: normal appearance, no masses or tenderness Abdomen: normal findings: soft, non-tender Pelvic: cervix normal in appearance, external genitalia normal, no adnexal masses or tenderness, no cervical motion tenderness, rectovaginal septum normal, uterus normal size, shape, and consistency and vagina normal without discharge Extremities: extremities normal, atraumatic, no cyanosis or edema    Assessment:    Healthy female exam.    Plan:    Contraception: condoms. Follow up in: 1 year.

## 2013-03-29 NOTE — Addendum Note (Signed)
Addended by: Jiles Garter on: 03/29/2013 03:36 PM   Modules accepted: Orders

## 2013-03-30 LAB — GC/CHLAMYDIA PROBE AMP
CT Probe RNA: NEGATIVE
GC Probe RNA: NEGATIVE

## 2013-03-30 LAB — PAP IG W/ RFLX HPV ASCU

## 2013-04-19 ENCOUNTER — Encounter: Payer: Self-pay | Admitting: Obstetrics

## 2013-05-11 ENCOUNTER — Encounter: Payer: Self-pay | Admitting: Obstetrics & Gynecology

## 2013-06-01 ENCOUNTER — Ambulatory Visit (INDEPENDENT_AMBULATORY_CARE_PROVIDER_SITE_OTHER): Payer: Medicaid Other | Admitting: Obstetrics

## 2013-06-01 VITALS — BP 112/86 | HR 72 | Temp 98.2°F | Ht 65.0 in | Wt 304.0 lb

## 2013-06-01 DIAGNOSIS — A499 Bacterial infection, unspecified: Secondary | ICD-10-CM

## 2013-06-01 DIAGNOSIS — N946 Dysmenorrhea, unspecified: Secondary | ICD-10-CM

## 2013-06-01 DIAGNOSIS — N760 Acute vaginitis: Secondary | ICD-10-CM | POA: Insufficient documentation

## 2013-06-01 DIAGNOSIS — A6000 Herpesviral infection of urogenital system, unspecified: Secondary | ICD-10-CM | POA: Insufficient documentation

## 2013-06-01 DIAGNOSIS — B9689 Other specified bacterial agents as the cause of diseases classified elsewhere: Secondary | ICD-10-CM

## 2013-06-01 MED ORDER — IBUPROFEN 800 MG PO TABS: 800.0000 mg | ORAL_TABLET | Freq: Three times a day (TID) | ORAL | Status: DC | PRN

## 2013-06-01 MED ORDER — METRONIDAZOLE 500 MG PO TABS: 500.0000 mg | ORAL_TABLET | Freq: Two times a day (BID) | ORAL | Status: DC

## 2013-06-01 MED ORDER — VALACYCLOVIR HCL 500 MG PO TABS: ORAL_TABLET | ORAL | Status: DC

## 2013-06-01 NOTE — Progress Notes (Signed)
Subjective:     Brittney Mcbride is a 33 y.o. female here for a routine exam.  Current complaints: Patient is in the office today for possible infection- patient c/o intching with odor. Patient has had symptoms for 2 weeks..    Personal health questionnaire:  Is patient Ashkenazi Jewish, have a family history of breast and/or ovarian cancer: no Is there a family history of uterine cancer diagnosed at age < 48, gastrointestinal cancer, urinary tract cancer, family member who is a Field seismologist syndrome-associated carrier: no Is the patient overweight and hypertensive, family history of diabetes, personal history of gestational diabetes or PCOS: yes Is patient over 32, have PCOS,  family history of premature CHD under age 56, diabetes, smoke, have hypertension or peripheral artery disease:  no  The HPI was reviewed and explored in further detail by the provider. Gynecologic History Patient's last menstrual period was 05/29/2013. Contraception: condoms Last Pap: 03/2013. Results were: normal Last mammogram: 01/2008. Results were: normal  Obstetric History OB History  Gravida Para Term Preterm AB SAB TAB Ectopic Multiple Living  1 1 1  0 0 0 0 0 0 1    # Outcome Date GA Lbr Len/2nd Weight Sex Delivery Anes PTL Lv  1 TRM     M SVD EPI N Y       The following portions of the patient's history were reviewed and updated as appropriate: allergies, current medications, past family history, past medical history, past social history, past surgical history and problem list.  Review of Systems Pertinent items are noted in HPI.    Objective:    General appearance: alert and no distress Abdomen: normal findings: soft, non-tender Pelvic: cervix normal in appearance, external genitalia normal, no adnexal masses or tenderness, no cervical motion tenderness, rectovaginal septum normal, uterus normal size, shape, and consistency and vagina with thn grey malodorous discharge    50% of 20 min visit spent on  counseling and coordination of care.   Assessment:    BV  Dysmenorhea  H/O Genital Herpes.   Plan:    Education reviewed: safe sex/STD prevention and management of BV. Follow up in: several months. Flagyl Rx   Ibuprofen Rx Valtrex Rx

## 2013-06-02 LAB — WET PREP BY MOLECULAR PROBE
Candida species: NEGATIVE
GARDNERELLA VAGINALIS: POSITIVE — AB
TRICHOMONAS VAG: NEGATIVE

## 2013-06-02 LAB — GC/CHLAMYDIA PROBE AMP
CT Probe RNA: NEGATIVE
GC PROBE AMP APTIMA: NEGATIVE

## 2013-06-05 ENCOUNTER — Encounter: Payer: Self-pay | Admitting: Obstetrics

## 2013-10-02 ENCOUNTER — Ambulatory Visit (INDEPENDENT_AMBULATORY_CARE_PROVIDER_SITE_OTHER): Payer: Medicaid Other | Admitting: Obstetrics

## 2013-10-02 ENCOUNTER — Encounter: Payer: Self-pay | Admitting: Obstetrics

## 2013-10-02 VITALS — Temp 98.4°F | Ht 64.0 in

## 2013-10-02 DIAGNOSIS — N760 Acute vaginitis: Secondary | ICD-10-CM

## 2013-10-02 DIAGNOSIS — Z79899 Other long term (current) drug therapy: Secondary | ICD-10-CM

## 2013-10-02 DIAGNOSIS — Z113 Encounter for screening for infections with a predominantly sexual mode of transmission: Secondary | ICD-10-CM

## 2013-10-03 ENCOUNTER — Telehealth: Payer: Self-pay | Admitting: *Deleted

## 2013-10-03 ENCOUNTER — Encounter: Payer: Self-pay | Admitting: Obstetrics

## 2013-10-03 ENCOUNTER — Other Ambulatory Visit: Payer: Self-pay | Admitting: *Deleted

## 2013-10-03 DIAGNOSIS — B9689 Other specified bacterial agents as the cause of diseases classified elsewhere: Secondary | ICD-10-CM

## 2013-10-03 DIAGNOSIS — N760 Acute vaginitis: Principal | ICD-10-CM

## 2013-10-03 LAB — WET PREP BY MOLECULAR PROBE
Candida species: NEGATIVE
Gardnerella vaginalis: POSITIVE — AB
Trichomonas vaginosis: NEGATIVE

## 2013-10-03 MED ORDER — METRONIDAZOLE 500 MG PO TABS: 500.0000 mg | ORAL_TABLET | Freq: Two times a day (BID) | ORAL | Status: DC

## 2013-10-03 NOTE — Telephone Encounter (Signed)
Fax from pharmacy- patient requesting refill on Tramadol.

## 2013-10-03 NOTE — Progress Notes (Signed)
Patient ID: Brittney Mcbride, female   DOB: 09-20-1980, 33 y.o.   MRN: [id number]  Chief Complaint  Patient presents with  . Follow-up    Medication     HPI Brittney Mcbride is a 33 y.o. female.  C/O malodorous vaginal discharge with itching.  HPI  Past Medical History  Diagnosis Date  . HSV-2 (herpes simplex virus 2) infection   . Allergy   . Fibroid   . Infection     UTI    Past Surgical History  Procedure Laterality Date  . Wisdom tooth extraction  2000    Family History  Problem Relation Age of Onset  . Diabetes Father   . Asthma Brother   . Diabetes Brother   . Early death Brother   . Drug abuse Maternal Aunt   . Drug abuse Paternal Uncle   . Alcohol abuse Maternal Grandmother   . Asthma Maternal Grandmother   . Alzheimer's disease Maternal Grandmother   . Alcohol abuse Maternal Grandfather   . Asthma Maternal Grandfather   . Alcohol abuse Paternal Grandmother   . Diabetes Paternal Grandmother   . Alcohol abuse Paternal Grandfather     Social History History  Substance Use Topics  . Smoking status: Never Smoker   . Smokeless tobacco: Never Used  . Alcohol Use: No    Allergies  Allergen Reactions  . Latex Itching    Current Outpatient Prescriptions  Medication Sig Dispense Refill  . ibuprofen (ADVIL,MOTRIN) 800 MG tablet Take 1 tablet (800 mg total) by mouth every 8 (eight) hours as needed for moderate pain.  30 tablet  5  . valACYclovir (VALTREX) 500 MG tablet Take 2 tablets ( 1000mg  ) po bid x 3 days prn each outbreak.  30 tablet  prn   No current facility-administered medications for this visit.    Review of Systems Review of Systems Constitutional: negative for fatigue and weight loss Respiratory: negative for cough and wheezing Cardiovascular: negative for chest pain, fatigue and palpitations Gastrointestinal: negative for abdominal pain and change in bowel habits Genitourinary:negative Integument/breast: negative for nipple  discharge Musculoskeletal:negative for myalgias Neurological: negative for gait problems and tremors Behavioral/Psych: negative for abusive relationship, depression Endocrine: negative for temperature intolerance     Temperature 98.4 F (36.9 C), height 5\' 4"  (1.626 m), last menstrual period 09/18/2013.  Physical Exam Physical Exam General:   alert  Skin:   no rash or abnormalities  Lungs:   clear to auscultation bilaterally  Heart:   regular rate and rhythm, S1, S2 normal, no murmur, click, rub or gallop  Breasts:   normal without suspicious masses, skin or nipple changes or axillary nodes  Abdomen:  normal findings: no organomegaly, soft, non-tender and no hernia  Pelvis:  External genitalia: normal general appearance Urinary system: urethral meatus normal and bladder without fullness, nontender Vaginal: normal without tenderness, induration or masses Cervix: normal appearance Adnexa: normal bimanual exam Uterus: anteverted and non-tender, normal size      Data Reviewed labs  Assessment    Vaginitis     Plan    Cultures and wet prep pending.  Orders Placed This Encounter  Procedures  . WET PREP BY MOLECULAR PROBE  . GC/Chlamydia Probe Amp   No orders of the defined types were placed in this encounter.        HARPER,CHARLES A 10/03/2013, 6:42 AM

## 2013-10-04 ENCOUNTER — Other Ambulatory Visit: Payer: Self-pay | Admitting: Obstetrics

## 2013-10-04 DIAGNOSIS — R52 Pain, unspecified: Secondary | ICD-10-CM

## 2013-10-04 LAB — GC/CHLAMYDIA PROBE AMP
CT Probe RNA: NEGATIVE
GC PROBE AMP APTIMA: NEGATIVE

## 2013-10-04 MED ORDER — TRAMADOL HCL 50 MG PO TABS: 50.0000 mg | ORAL_TABLET | Freq: Four times a day (QID) | ORAL | Status: DC | PRN

## 2014-01-01 ENCOUNTER — Encounter: Payer: Self-pay | Admitting: Obstetrics

## 2014-02-26 ENCOUNTER — Encounter: Payer: Self-pay | Admitting: *Deleted

## 2014-02-27 ENCOUNTER — Encounter: Payer: Self-pay | Admitting: Obstetrics & Gynecology

## 2014-03-02 NOTE — L&D Delivery Note (Signed)
Delivery Note At 10:42 AM a viable female was delivered via Vaginal, Spontaneous Delivery (Presentation: Left Occiput Anterior).  APGAR: 8, 8; weight  .   Placenta status: Intact, Spontaneous.  Cord: 3 vessels with the following complications: None.  Cord pH: not done  Anesthesia: Epidural  Episiotomy: None Lacerations: 2nd degree Suture Repair: 2.0 vicryl Est. Blood Loss (mL):    Mom to postpartum.  Baby to Couplet care / Skin to Skin.  Jaxtin Raimondo A 12/30/2014, 10:58 AM

## 2014-04-23 ENCOUNTER — Ambulatory Visit: Payer: Medicaid Other | Admitting: Certified Nurse Midwife

## 2014-05-02 ENCOUNTER — Encounter: Payer: Self-pay | Admitting: Certified Nurse Midwife

## 2014-05-02 ENCOUNTER — Ambulatory Visit (INDEPENDENT_AMBULATORY_CARE_PROVIDER_SITE_OTHER): Payer: Medicaid Other | Admitting: Certified Nurse Midwife

## 2014-05-02 VITALS — BP 129/86 | HR 94 | Temp 98.9°F | Ht 64.5 in | Wt 292.0 lb

## 2014-05-02 DIAGNOSIS — Z3200 Encounter for pregnancy test, result unknown: Secondary | ICD-10-CM

## 2014-05-02 DIAGNOSIS — Z01419 Encounter for gynecological examination (general) (routine) without abnormal findings: Secondary | ICD-10-CM

## 2014-05-02 DIAGNOSIS — Z3490 Encounter for supervision of normal pregnancy, unspecified, unspecified trimester: Secondary | ICD-10-CM

## 2014-05-02 DIAGNOSIS — Z3491 Encounter for supervision of normal pregnancy, unspecified, first trimester: Secondary | ICD-10-CM

## 2014-05-02 DIAGNOSIS — Z331 Pregnant state, incidental: Secondary | ICD-10-CM

## 2014-05-02 DIAGNOSIS — Z3401 Encounter for supervision of normal first pregnancy, first trimester: Secondary | ICD-10-CM

## 2014-05-02 DIAGNOSIS — Z0000 Encounter for general adult medical examination without abnormal findings: Secondary | ICD-10-CM

## 2014-05-02 LAB — POCT URINE PREGNANCY: Preg Test, Ur: POSITIVE

## 2014-05-02 LAB — TSH: TSH: 2.596 u[IU]/mL (ref 0.350–4.500)

## 2014-05-03 ENCOUNTER — Encounter: Payer: Self-pay | Admitting: Certified Nurse Midwife

## 2014-05-03 DIAGNOSIS — Z3490 Encounter for supervision of normal pregnancy, unspecified, unspecified trimester: Secondary | ICD-10-CM | POA: Insufficient documentation

## 2014-05-03 LAB — OBSTETRIC PANEL
ANTIBODY SCREEN: NEGATIVE
BASOS PCT: 1 % (ref 0–1)
Basophils Absolute: 0.1 10*3/uL (ref 0.0–0.1)
EOS ABS: 0.2 10*3/uL (ref 0.0–0.7)
Eosinophils Relative: 2 % (ref 0–5)
HEMATOCRIT: 34 % — AB (ref 36.0–46.0)
HEMOGLOBIN: 10.8 g/dL — AB (ref 12.0–15.0)
HEP B S AG: NEGATIVE
LYMPHS ABS: 2.4 10*3/uL (ref 0.7–4.0)
Lymphocytes Relative: 31 % (ref 12–46)
MCH: 24.3 pg — AB (ref 26.0–34.0)
MCHC: 31.8 g/dL (ref 30.0–36.0)
MCV: 76.6 fL — ABNORMAL LOW (ref 78.0–100.0)
MONOS PCT: 6 % (ref 3–12)
MPV: 9.7 fL (ref 8.6–12.4)
Monocytes Absolute: 0.5 10*3/uL (ref 0.1–1.0)
Neutro Abs: 4.6 10*3/uL (ref 1.7–7.7)
Neutrophils Relative %: 60 % (ref 43–77)
Platelets: 290 10*3/uL (ref 150–400)
RBC: 4.44 MIL/uL (ref 3.87–5.11)
RDW: 17.1 % — AB (ref 11.5–15.5)
RH TYPE: POSITIVE
Rubella: 1.95 Index — ABNORMAL HIGH (ref <0.90)
WBC: 7.7 10*3/uL (ref 4.0–10.5)

## 2014-05-03 LAB — VITAMIN D 25 HYDROXY (VIT D DEFICIENCY, FRACTURES): Vit D, 25-Hydroxy: 6 ng/mL — ABNORMAL LOW (ref 30–100)

## 2014-05-03 LAB — HIV ANTIBODY (ROUTINE TESTING W REFLEX): HIV 1&2 Ab, 4th Generation: NONREACTIVE

## 2014-05-03 LAB — VARICELLA ZOSTER ANTIBODY, IGG: Varicella IgG: 1540 Index — ABNORMAL HIGH (ref <135.00)

## 2014-05-03 NOTE — Progress Notes (Signed)
Patient ID: MEHA VIDRINE, female   DOB: 13-Nov-1980, 34 y.o.   MRN: [id number] Subjective:    Brittney Mcbride is being seen today for her annual exam, she had an in office + urine pregnancy test. Was five days late for her menstaul cycle in February. This is not a planned pregnancy. She is at Unknown gestation. Her obstetrical history is significant for obesity. Relationship with FOB: spouse, living together. Patient does intend to breast feed. Pregnancy history fully reviewed.  The information documented in the HPI was reviewed and verified.  Menstrual History: OB History    Gravida Para Term Preterm AB TAB SAB Ectopic Multiple Living   1 1 1  0 0 0 0 0 0 1      Menarche age: 30  Patient's last menstrual period was 03/30/2014.    Past Medical History  Diagnosis Date  . HSV-2 (herpes simplex virus 2) infection   . Allergy   . Fibroid   . Infection     UTI    Past Surgical History  Procedure Laterality Date  . Wisdom tooth extraction  2000     (Not in a hospital admission) Allergies  Allergen Reactions  . Latex Itching    History  Substance Use Topics  . Smoking status: Never Smoker   . Smokeless tobacco: Never Used  . Alcohol Use: No    Family History  Problem Relation Age of Onset  . Diabetes Father   . Asthma Brother   . Diabetes Brother   . Early death Brother   . Drug abuse Maternal Aunt   . Drug abuse Paternal Uncle   . Alcohol abuse Maternal Grandmother   . Asthma Maternal Grandmother   . Alzheimer's disease Maternal Grandmother   . Alcohol abuse Maternal Grandfather   . Asthma Maternal Grandfather   . Alcohol abuse Paternal Grandmother   . Diabetes Paternal Grandmother   . Alcohol abuse Paternal Grandfather      Review of Systems Constitutional: negative for weight loss Gastrointestinal: negative for vomiting Genitourinary:negative for genital lesions and vaginal discharge and dysuria Musculoskeletal:negative for back pain Behavioral/Psych: negative  for abusive relationship, depression, illegal drug usage and tobacco use    Objective:    BP 129/86 mmHg  Pulse 94  Temp(Src) 98.9 F (37.2 C)  Ht 5' 4.5" (1.638 m)  Wt 132.45 kg (292 lb)  BMI 49.37 kg/m2  LMP 03/30/2014 General Appearance:    Alert, cooperative, no distress, appears stated age  Head:    Normocephalic, without obvious abnormality, atraumatic  Eyes:    PERRL, conjunctiva/corneas clear, EOM's intact, fundi    benign, both eyes  Ears:    Normal TM's and external ear canals, both ears  Nose:   Nares normal, septum midline, mucosa normal, no drainage    or sinus tenderness  Throat:   Lips, mucosa, and tongue normal; teeth and gums normal  Neck:   Supple, symmetrical, trachea midline, no adenopathy;    thyroid:  no enlargement/tenderness/nodules; no carotid   bruit or JVD  Back:     Symmetric, no curvature, ROM normal, no CVA tenderness  Lungs:     Clear to auscultation bilaterally, respirations unlabored  Chest Wall:    No tenderness or deformity   Heart:    Regular rate and rhythm, S1 and S2 normal, no murmur, rub   or gallop  Breast Exam:    No tenderness, masses, or nipple abnormality  Abdomen:     Soft, non-tender, bowel sounds active  all four quadrants,    no masses, no organomegaly  Genitalia:    Normal female without lesion, discharge or tenderness  Extremities:   Extremities normal, atraumatic, no cyanosis or edema  Pulses:   2+ and symmetric all extremities  Skin:   Skin color, texture, turgor normal, no rashes or lesions  Lymph nodes:   Cervical, supraclavicular, and axillary nodes normal  Neurologic:   CNII-XII intact, normal strength, sensation and reflexes    throughout      Lab Review Urine pregnancy test Labs reviewed yes Radiologic studies reviewed no Assessment:    Pregnancy around 4 weeks   Normal Female Exam. Plan:      Prenatal vitamins, samples given.  Counseling provided regarding continued use of seat belts, cessation of alcohol  consumption, smoking or use of illicit drugs; infection precautions i.e., influenza/TDAP immunizations, toxoplasmosis,CMV, parvovirus, listeria and varicella; workplace safety, exercise during pregnancy; routine dental care, safe medications, sexual activity, hot tubs, saunas, pools, travel, caffeine use, fish and methlymercury, potential toxins, hair treatments, varicose veins Weight gain recommendations per IOM guidelines reviewed: underweight/BMI< 18.5--> gain 28 - 40 lbs; normal weight/BMI 18.5 - 24.9--> gain 25 - 35 lbs; overweight/BMI 25 - 29.9--> gain 15 - 25 lbs; obese/BMI >30->gain  11 - 20 lbs Problem list reviewed and updated. FIRST/CF mutation testing/NIPT/QUAD SCREEN/fragile X/Ashkenazi Jewish population testing/Spinal muscular atrophy discussed: undecided. Role of ultrasound in pregnancy discussed; fetal survey: requested. Amniocentesis discussed: not indicated.  No orders of the defined types were placed in this encounter.   Orders Placed This Encounter  Procedures  . SureSwab, Vaginosis/Vaginitis Plus  . Culture, OB Urine  . Obstetric panel  . HIV antibody  . Hemoglobinopathy evaluation  . Varicella zoster antibody, IgG  . Vit D  25 hydroxy (rtn osteoporosis monitoring)  . TSH  . POCT urine pregnancy  . POCT urine pregnancy  . POCT urinalysis dipstick   Pap sent.  Follow up in about 6 weeks for new ob appointment.

## 2014-05-04 LAB — PAP IG AND HPV HIGH-RISK: HPV DNA HIGH RISK: NOT DETECTED

## 2014-05-04 LAB — HEMOGLOBINOPATHY EVALUATION
HGB A2 QUANT: 2.2 % (ref 2.2–3.2)
HGB A: 97.8 % (ref 96.8–97.8)
HGB F QUANT: 0 % (ref 0.0–2.0)
Hemoglobin Other: 0 %
Hgb S Quant: 0 %

## 2014-05-04 LAB — CULTURE, OB URINE: Colony Count: 2000

## 2014-05-05 LAB — SURESWAB, VAGINOSIS/VAGINITIS PLUS
Atopobium vaginae: 6.3 Log (cells/mL)
C. PARAPSILOSIS, DNA: NOT DETECTED
C. albicans, DNA: DETECTED — AB
C. glabrata, DNA: NOT DETECTED
C. trachomatis RNA, TMA: DETECTED — AB
C. tropicalis, DNA: NOT DETECTED
Gardnerella vaginalis: >8 Log (cells/mL)
LACTOBACILLUS SPECIES: NOT DETECTED Log (cells/mL)
MEGASPHAERA SPECIES: 7.8 Log (cells/mL)
N. gonorrhoeae RNA, TMA: NOT DETECTED
T. vaginalis RNA, QL TMA: NOT DETECTED

## 2014-05-07 ENCOUNTER — Other Ambulatory Visit: Payer: Self-pay | Admitting: *Deleted

## 2014-05-07 DIAGNOSIS — Z3490 Encounter for supervision of normal pregnancy, unspecified, unspecified trimester: Secondary | ICD-10-CM

## 2014-05-07 MED ORDER — PRENATE MINI 29-0.6-0.4-350 MG PO CAPS: 1.0000 | ORAL_CAPSULE | Freq: Every day | ORAL | Status: DC

## 2014-05-07 NOTE — Progress Notes (Signed)
PNV sent to pharmacy. See lab note.

## 2014-05-08 ENCOUNTER — Other Ambulatory Visit: Payer: Self-pay | Admitting: *Deleted

## 2014-05-08 DIAGNOSIS — B3731 Acute candidiasis of vulva and vagina: Secondary | ICD-10-CM

## 2014-05-08 DIAGNOSIS — B9689 Other specified bacterial agents as the cause of diseases classified elsewhere: Secondary | ICD-10-CM

## 2014-05-08 DIAGNOSIS — N760 Acute vaginitis: Secondary | ICD-10-CM

## 2014-05-08 DIAGNOSIS — A749 Chlamydial infection, unspecified: Secondary | ICD-10-CM

## 2014-05-08 DIAGNOSIS — B373 Candidiasis of vulva and vagina: Secondary | ICD-10-CM

## 2014-05-08 MED ORDER — AZITHROMYCIN 250 MG PO TABS: ORAL_TABLET | ORAL | Status: DC

## 2014-05-08 MED ORDER — FLUCONAZOLE 150 MG PO TABS: 150.0000 mg | ORAL_TABLET | ORAL | Status: DC

## 2014-05-08 MED ORDER — METRONIDAZOLE 500 MG PO TABS: 500.0000 mg | ORAL_TABLET | Freq: Two times a day (BID) | ORAL | Status: DC

## 2014-05-17 ENCOUNTER — Inpatient Hospital Stay (HOSPITAL_COMMUNITY): Payer: Medicaid Other

## 2014-05-17 ENCOUNTER — Encounter (HOSPITAL_COMMUNITY): Payer: Self-pay | Admitting: *Deleted

## 2014-05-17 ENCOUNTER — Inpatient Hospital Stay (HOSPITAL_COMMUNITY)
Admission: AD | Admit: 2014-05-17 | Discharge: 2014-05-17 | Disposition: A | Discharge status: Home / Self Care | Payer: Medicaid Other | Source: Ambulatory Visit | Attending: Obstetrics | Admitting: Obstetrics

## 2014-05-17 DIAGNOSIS — D252 Subserosal leiomyoma of uterus: Secondary | ICD-10-CM | POA: Diagnosis not present

## 2014-05-17 DIAGNOSIS — O219 Vomiting of pregnancy, unspecified: Secondary | ICD-10-CM

## 2014-05-17 DIAGNOSIS — R1030 Lower abdominal pain, unspecified: Secondary | ICD-10-CM | POA: Diagnosis not present

## 2014-05-17 DIAGNOSIS — R102 Pelvic and perineal pain: Secondary | ICD-10-CM

## 2014-05-17 DIAGNOSIS — O26891 Other specified pregnancy related conditions, first trimester: Secondary | ICD-10-CM

## 2014-05-17 DIAGNOSIS — O3411 Maternal care for benign tumor of corpus uteri, first trimester: Secondary | ICD-10-CM | POA: Diagnosis not present

## 2014-05-17 DIAGNOSIS — O210 Mild hyperemesis gravidarum: Secondary | ICD-10-CM | POA: Insufficient documentation

## 2014-05-17 DIAGNOSIS — N949 Unspecified condition associated with female genital organs and menstrual cycle: Secondary | ICD-10-CM | POA: Diagnosis not present

## 2014-05-17 LAB — HCG, QUANTITATIVE, PREGNANCY: HCG, BETA CHAIN, QUANT, S: 84300 m[IU]/mL — AB (ref <5)

## 2014-05-17 LAB — CBC
HCT: 35.8 % — ABNORMAL LOW (ref 36.0–46.0)
HEMOGLOBIN: 11.5 g/dL — AB (ref 12.0–15.0)
MCH: 24.8 pg — AB (ref 26.0–34.0)
MCHC: 32.1 g/dL (ref 30.0–36.0)
MCV: 77.2 fL — ABNORMAL LOW (ref 78.0–100.0)
PLATELETS: 250 10*3/uL (ref 150–400)
RBC: 4.64 MIL/uL (ref 3.87–5.11)
RDW: 16.4 % — ABNORMAL HIGH (ref 11.5–15.5)
WBC: 9.2 10*3/uL (ref 4.0–10.5)

## 2014-05-17 LAB — URINALYSIS, ROUTINE W REFLEX MICROSCOPIC
Glucose, UA: NEGATIVE mg/dL
Ketones, ur: >80 mg/dL — AB
Leukocytes, UA: NEGATIVE
NITRITE: NEGATIVE
PROTEIN: 30 mg/dL — AB
Urobilinogen, UA: 0.2 mg/dL (ref 0.0–1.0)
pH: 6 (ref 5.0–8.0)

## 2014-05-17 LAB — URINE MICROSCOPIC-ADD ON

## 2014-05-17 MED ORDER — PROMETHAZINE HCL 25 MG/ML IJ SOLN
25.0000 mg | Freq: Once | INTRAVENOUS | Status: AC
  Administered 2014-05-17: 25 mg via INTRAVENOUS
  Filled 2014-05-17: qty 1

## 2014-05-17 MED ORDER — PROMETHAZINE HCL 25 MG PO TABS: 12.5000 mg | ORAL_TABLET | Freq: Four times a day (QID) | ORAL | Status: DC | PRN

## 2014-05-17 MED ORDER — LACTATED RINGERS IV BOLUS (SEPSIS)
1000.0000 mL | Freq: Once | INTRAVENOUS | Status: AC
  Administered 2014-05-17: 1000 mL via INTRAVENOUS

## 2014-05-17 MED ORDER — FAMOTIDINE IN NACL 20-0.9 MG/50ML-% IV SOLN
20.0000 mg | Freq: Once | INTRAVENOUS | Status: AC
  Administered 2014-05-17: 20 mg via INTRAVENOUS
  Filled 2014-05-17: qty 50

## 2014-05-17 NOTE — Discharge Instructions (Signed)
Nausea medication to take during pregnancy:   Unisom (doxylamine succinate 25 mg tablets) Take one tablet daily at bedtime. If symptoms are not adequately controlled, the dose can be increased to a maximum recommended dose of two tablets daily (1/2 tablet in the morning, 1/2 tablet mid-afternoon and one at bedtime).  Vitamin B6 100mg  tablets. Take one tablet twice a day (up to 200 mg per day).   First Trimester of Pregnancy The first trimester of pregnancy is from week 1 until the end of week 12 (months 1 through 3). A week after a sperm fertilizes an egg, the egg will implant on the wall of the uterus. This embryo will begin to develop into a baby. Genes from you and your partner are forming the baby. The female genes determine whether the baby is a boy or a girl. At 6-8 weeks, the eyes and face are formed, and the heartbeat can be seen on ultrasound. At the end of 12 weeks, all the baby's organs are formed.  Now that you are pregnant, you will want to do everything you can to have a healthy baby. Two of the most important things are to get good prenatal care and to follow your health care provider's instructions. Prenatal care is all the medical care you receive before the baby's birth. This care will help prevent, find, and treat any problems during the pregnancy and childbirth. BODY CHANGES Your body goes through many changes during pregnancy. The changes vary from woman to woman.   You may gain or lose a couple of pounds at first.  You may feel sick to your stomach (nauseous) and throw up (vomit). If the vomiting is uncontrollable, call your health care provider.  You may tire easily.  You may develop headaches that can be relieved by medicines approved by your health care provider.  You may urinate more often. Painful urination may mean you have a bladder infection.  You may develop heartburn as a result of your pregnancy.  You may develop constipation because certain hormones are  causing the muscles that push waste through your intestines to slow down.  You may develop hemorrhoids or swollen, bulging veins (varicose veins).  Your breasts may begin to grow larger and become tender. Your nipples may stick out more, and the tissue that surrounds them (areola) may become darker.  Your gums may bleed and may be sensitive to brushing and flossing.  Dark spots or blotches (chloasma, mask of pregnancy) may develop on your face. This will likely fade after the baby is born.  Your menstrual periods will stop.  You may have a loss of appetite.  You may develop cravings for certain kinds of food.  You may have changes in your emotions from day to day, such as being excited to be pregnant or being concerned that something may go wrong with the pregnancy and baby.  You may have more vivid and strange dreams.  You may have changes in your hair. These can include thickening of your hair, rapid growth, and changes in texture. Some women also have hair loss during or after pregnancy, or hair that feels dry or thin. Your hair will most likely return to normal after your baby is born. WHAT TO EXPECT AT YOUR PRENATAL VISITS During a routine prenatal visit:  You will be weighed to make sure you and the baby are growing normally.  Your blood pressure will be taken.  Your abdomen will be measured to track your baby's growth.  The  fetal heartbeat will be listened to starting around week 10 or 12 of your pregnancy.  Test results from any previous visits will be discussed. Your health care provider may ask you:  How you are feeling.  If you are feeling the baby move.  If you have had any abnormal symptoms, such as leaking fluid, bleeding, severe headaches, or abdominal cramping.  If you have any questions. Other tests that may be performed during your first trimester include:  Blood tests to find your blood type and to check for the presence of any previous infections. They  will also be used to check for low iron levels (anemia) and Rh antibodies. Later in the pregnancy, blood tests for diabetes will be done along with other tests if problems develop.  Urine tests to check for infections, diabetes, or protein in the urine.  An ultrasound to confirm the proper growth and development of the baby.  An amniocentesis to check for possible genetic problems.  Fetal screens for spina bifida and Down syndrome.  You may need other tests to make sure you and the baby are doing well. HOME CARE INSTRUCTIONS  Medicines  Follow your health care provider's instructions regarding medicine use. Specific medicines may be either safe or unsafe to take during pregnancy.  Take your prenatal vitamins as directed.  If you develop constipation, try taking a stool softener if your health care provider approves. Diet  Eat regular, well-balanced meals. Choose a variety of foods, such as meat or vegetable-based protein, fish, milk and low-fat dairy products, vegetables, fruits, and whole grain breads and cereals. Your health care provider will help you determine the amount of weight gain that is right for you.  Avoid raw meat and uncooked cheese. These carry germs that can cause birth defects in the baby.  Eating four or five small meals rather than three large meals a day may help relieve nausea and vomiting. If you start to feel nauseous, eating a few soda crackers can be helpful. Drinking liquids between meals instead of during meals also seems to help nausea and vomiting.  If you develop constipation, eat more high-fiber foods, such as fresh vegetables or fruit and whole grains. Drink enough fluids to keep your urine clear or pale yellow. Activity and Exercise  Exercise only as directed by your health care provider. Exercising will help you:  Control your weight.  Stay in shape.  Be prepared for labor and delivery.  Experiencing pain or cramping in the lower abdomen or low  back is a good sign that you should stop exercising. Check with your health care provider before continuing normal exercises.  Try to avoid standing for long periods of time. Move your legs often if you must stand in one place for a long time.  Avoid heavy lifting.  Wear low-heeled shoes, and practice good posture.  You may continue to have sex unless your health care provider directs you otherwise. Relief of Pain or Discomfort  Wear a good support bra for breast tenderness.   Take warm sitz baths to soothe any pain or discomfort caused by hemorrhoids. Use hemorrhoid cream if your health care provider approves.   Rest with your legs elevated if you have leg cramps or low back pain.  If you develop varicose veins in your legs, wear support hose. Elevate your feet for 15 minutes, 3-4 times a day. Limit salt in your diet. Prenatal Care  Schedule your prenatal visits by the twelfth week of pregnancy. They are usually  scheduled monthly at first, then more often in the last 2 months before delivery.  Write down your questions. Take them to your prenatal visits.  Keep all your prenatal visits as directed by your health care provider. Safety  Wear your seat belt at all times when driving.  Make a list of emergency phone numbers, including numbers for family, friends, the hospital, and police and fire departments. General Tips  Ask your health care provider for a referral to a local prenatal education class. Begin classes no later than at the beginning of month 6 of your pregnancy.  Ask for help if you have counseling or nutritional needs during pregnancy. Your health care provider can offer advice or refer you to specialists for help with various needs.  Do not use hot tubs, steam rooms, or saunas.  Do not douche or use tampons or scented sanitary pads.  Do not cross your legs for long periods of time.  Avoid cat litter boxes and soil used by cats. These carry germs that can cause  birth defects in the baby and possibly loss of the fetus by miscarriage or stillbirth.  Avoid all smoking, herbs, alcohol, and medicines not prescribed by your health care provider. Chemicals in these affect the formation and growth of the baby.  Schedule a dentist appointment. At home, brush your teeth with a soft toothbrush and be gentle when you floss. SEEK MEDICAL CARE IF:   You have dizziness.  You have mild pelvic cramps, pelvic pressure, or nagging pain in the abdominal area.  You have persistent nausea, vomiting, or diarrhea.  You have a bad smelling vaginal discharge.  You have pain with urination.  You notice increased swelling in your face, hands, legs, or ankles. SEEK IMMEDIATE MEDICAL CARE IF:   You have a fever.  You are leaking fluid from your vagina.  You have spotting or bleeding from your vagina.  You have severe abdominal cramping or pain.  You have rapid weight gain or loss.  You vomit blood or material that looks like coffee grounds.  You are exposed to [place] measles and have never had them.  You are exposed to fifth disease or chickenpox.  You develop a severe headache.  You have shortness of breath.  You have any kind of trauma, such as from a fall or a car accident. Document Released: 02/10/2001 Document Revised: 07/03/2013 Document Reviewed: 12/27/2012 [facility] Patient Information 2015 [place], [place]. This information is not intended to replace advice given to you by your health care provider. Make sure you discuss any questions you have with your health care provider.

## 2014-05-17 NOTE — MAU Note (Signed)
C/o N&V for about a week; c/o L lower abdominal and R lower back pain;

## 2014-05-17 NOTE — MAU Provider Note (Signed)
History     CSN: [id number]  Arrival date and time: 05/17/14 1224   First Provider Initiated Contact with Patient 05/17/14 1316      Chief Complaint  Patient presents with  . Nausea  . Emesis  . Abdominal Pain  . Back Pain   HPI Comments: Brittney Mcbride is a 34 y.o. G2P1001 at [redacted]w[redacted]d who presents today with nausea and vomiting. She states that she has not been able to eat anything since Sunday. She states that she has been vomiting about 2-3 times per day, but is unable to eat. She has not been given any nausea medications by her OB. She states that she has taken the abx given by her OB, but was not able to complete the full course of the flagyl. She was able to take the Azithromycin without any trouble. She does reports lower abdominal pain and back pain. She denies any vaginal bleeding.   Emesis  Associated symptoms include abdominal pain and chills. Pertinent negatives include no diarrhea, fever or headaches.  Abdominal Pain Associated symptoms include nausea and vomiting. Pertinent negatives include no constipation, diarrhea, dysuria, fever, frequency or headaches.  Back Pain Associated symptoms include abdominal pain. Pertinent negatives include no dysuria, fever or headaches.      Past Medical History  Diagnosis Date  . HSV-2 (herpes simplex virus 2) infection   . Allergy   . Fibroid   . Infection     UTI    Past Surgical History  Procedure Laterality Date  . Wisdom tooth extraction  2000    Family History  Problem Relation Age of Onset  . Diabetes Father   . Asthma Brother   . Diabetes Brother   . Early death Brother   . Drug abuse Maternal Aunt   . Drug abuse Paternal Uncle   . Alcohol abuse Maternal Grandmother   . Asthma Maternal Grandmother   . Alzheimer's disease Maternal Grandmother   . Alcohol abuse Maternal Grandfather   . Asthma Maternal Grandfather   . Alcohol abuse Paternal Grandmother   . Diabetes Paternal Grandmother   . Alcohol abuse  Paternal Grandfather     History  Substance Use Topics  . Smoking status: Never Smoker   . Smokeless tobacco: Never Used  . Alcohol Use: No    Allergies:  Allergies  Allergen Reactions  . Latex Itching    Prescriptions prior to admission  Medication Sig Dispense Refill Last Dose  . azithromycin (ZITHROMAX Z-PAK) 250 MG tablet 1 gram as a one time dose 4 tablet 0   . fluconazole (DIFLUCAN) 150 MG tablet Take 1 tablet (150 mg total) by mouth every other day. 2 tablet 0   . ibuprofen (ADVIL,MOTRIN) 800 MG tablet Take 1 tablet (800 mg total) by mouth every 8 (eight) hours as needed for moderate pain. 30 tablet 5 Taking  . metroNIDAZOLE (FLAGYL) 500 MG tablet Take 1 tablet (500 mg total) by mouth 2 (two) times daily. (Patient not taking: Reported on 05/02/2014) 14 tablet 0 Not Taking  . metroNIDAZOLE (FLAGYL) 500 MG tablet Take 1 tablet (500 mg total) by mouth 2 (two) times daily. 14 tablet 0   . Prenat w/o A-FeCbn-Meth-FA-DHA (PRENATE MINI) 29-0.6-0.4-350 MG CAPS Take 1 capsule by mouth daily. 30 capsule 11   . traMADol (ULTRAM) 50 MG tablet Take 1-2 tablets (50-100 mg total) by mouth every 6 (six) hours as needed for moderate pain. (Patient not taking: Reported on 05/02/2014) 40 tablet 2 Not Taking  . valACYclovir (  VALTREX) 500 MG tablet Take 2 tablets ( 1000mg  ) po bid x 3 days prn each outbreak. 30 tablet prn Taking    Review of Systems  Constitutional: Positive for chills. Negative for fever.  Eyes: Negative for blurred vision.  Gastrointestinal: Positive for nausea, vomiting and abdominal pain. Negative for diarrhea and constipation.  Genitourinary: Negative for dysuria, urgency and frequency.  Musculoskeletal: Positive for back pain.  Neurological: Negative for headaches.  All other systems reviewed and are negative.  Physical Exam   Blood pressure 132/75, pulse 61, temperature 98.7 F (37.1 C), temperature source Oral, resp. rate 18, height 5' 4.5" (1.638 m), weight 126.1 kg  (278 lb), last menstrual period 03/30/2014.  Physical Exam  Nursing note and vitals reviewed. Constitutional: She is oriented to person, place, and time. She appears well-developed and well-nourished. No distress.  Cardiovascular: Normal rate.   Respiratory: Effort normal.  GI: Soft. There is no tenderness. There is no rebound.  Neurological: She is alert and oriented to person, place, and time.  Skin: Skin is warm and dry.  Psychiatric: She has a normal mood and affect.    MAU Course  Procedures  Results for orders placed or performed during the hospital encounter of 05/17/14 (from the past 24 hour(s))  Urinalysis, Routine w reflex microscopic     Status: Abnormal   Collection Time: 05/17/14  1:01 PM  Result Value Ref Range   Color, Urine YELLOW YELLOW   APPearance HAZY (A) CLEAR   Specific Gravity, Urine >1.030 (H) 1.005 - 1.030   pH 6.0 5.0 - 8.0   Glucose, UA NEGATIVE NEGATIVE mg/dL   Hgb urine dipstick SMALL (A) NEGATIVE   Bilirubin Urine SMALL (A) NEGATIVE   Ketones, ur >80 (A) NEGATIVE mg/dL   Protein, ur 30 (A) NEGATIVE mg/dL   Urobilinogen, UA 0.2 0.0 - 1.0 mg/dL   Nitrite NEGATIVE NEGATIVE   Leukocytes, UA NEGATIVE NEGATIVE  Urine microscopic-add on     Status: Abnormal   Collection Time: 05/17/14  1:01 PM  Result Value Ref Range   Squamous Epithelial / LPF MANY (A) RARE   RBC / HPF 0-2 <3 RBC/hpf   Bacteria, UA MANY (A) RARE   Urine-Other MUCOUS PRESENT   CBC     Status: Abnormal   Collection Time: 05/17/14  2:20 PM  Result Value Ref Range   WBC 9.2 4.0 - 10.5 K/uL   RBC 4.64 3.87 - 5.11 MIL/uL   Hemoglobin 11.5 (L) 12.0 - 15.0 g/dL   HCT 35.8 (L) 36.0 - 46.0 %   MCV 77.2 (L) 78.0 - 100.0 fL   MCH 24.8 (L) 26.0 - 34.0 pg   MCHC 32.1 30.0 - 36.0 g/dL   RDW 16.4 (H) 11.5 - 15.5 %   Platelets 250 150 - 400 K/uL   US Ob Comp Less 14 Wks  05/17/2014   CLINICAL DATA:  Left lower quadrant pain and right lower back pain. Patient is pregnant.  EXAM: OBSTETRIC  <14 WK [place] AND TRANSVAGINAL OB US  TECHNIQUE: Both transabdominal and transvaginal ultrasound examinations were performed for complete evaluation of the gestation as well as the maternal uterus, adnexal regions, and pelvic cul-de-sac. Transvaginal technique was performed to assess early pregnancy.  COMPARISON:  Pelvic ultrasound 07/31/2012  FINDINGS: Intrauterine gestational sac: Visualized/normal in shape.  Yolk sac:  Present  Embryo:  Present  Cardiac Activity: Present  Heart Rate: 127  bpm  CRL:  7  mm   6 w   4 d  [place] EDC: 01/06/2015  Maternal uterus/adnexae: No subchorionic hemorrhage. The right ovary is normal. The left ovary is not visualized. No free fluid in the pelvis. Note is made of a posterior subserosal uterine fibroid measuring 2.8 cm.  IMPRESSION: Single live intrauterine gestation 6 weeks 6 days by LMP.   Electronically Signed   By: Lovey Newcomer M.D.   On: 05/17/2014 15:31   US Ob Transvaginal  05/17/2014   CLINICAL DATA:  Left lower quadrant pain and right lower back pain. Patient is pregnant.  EXAM: OBSTETRIC <14 WK [place] AND TRANSVAGINAL OB US  TECHNIQUE: Both transabdominal and transvaginal ultrasound examinations were performed for complete evaluation of the gestation as well as the maternal uterus, adnexal regions, and pelvic cul-de-sac. Transvaginal technique was performed to assess early pregnancy.  COMPARISON:  Pelvic ultrasound 07/31/2012  FINDINGS: Intrauterine gestational sac: Visualized/normal in shape.  Yolk sac:  Present  Embryo:  Present  Cardiac Activity: Present  Heart Rate: 127  bpm  CRL:  7  mm   6 w   4 d                  [place] EDC: 01/06/2015  Maternal uterus/adnexae: No subchorionic hemorrhage. The right ovary is normal. The left ovary is not visualized. No free fluid in the pelvis. Note is made of a posterior subserosal uterine fibroid measuring 2.8 cm.  IMPRESSION: Single live intrauterine gestation 6 weeks 6 days by LMP.   Electronically Signed   By: Lovey Newcomer  M.D.   On: 05/17/2014 15:31    1718: Patient has had 1L D5LR with phenergan, 1 L of LR and 20mg  pepcid. She is tolerating PO.  Assessment and Plan   1. Nausea/vomiting in pregnancy   2. Pelvic pain affecting pregnancy in first trimester, antepartum    DC home Rx phenergan Instructions on how to take Vit B6 and unisom Return to MAU as needed  Follow-up Information    Follow up with Shelly Bombard, MD.   Specialty:  Obstetrics and Gynecology   Why:  As scheduled   Contact information:   [address] [id number] [phone]        Mathis Bud 05/17/2014, 1:17 PM

## 2014-05-28 ENCOUNTER — Inpatient Hospital Stay (HOSPITAL_COMMUNITY)
Admission: AD | Admit: 2014-05-28 | Discharge: 2014-06-01 | DRG: 781 | Disposition: A | Discharge status: Home / Self Care | Payer: Medicaid Other | Source: Ambulatory Visit | Attending: Obstetrics | Admitting: Obstetrics

## 2014-05-28 ENCOUNTER — Encounter (HOSPITAL_COMMUNITY): Payer: Self-pay | Admitting: *Deleted

## 2014-05-28 DIAGNOSIS — Z3A08 8 weeks gestation of pregnancy: Secondary | ICD-10-CM | POA: Diagnosis present

## 2014-05-28 DIAGNOSIS — E860 Dehydration: Secondary | ICD-10-CM | POA: Diagnosis present

## 2014-05-28 DIAGNOSIS — E876 Hypokalemia: Secondary | ICD-10-CM | POA: Diagnosis present

## 2014-05-28 DIAGNOSIS — O211 Hyperemesis gravidarum with metabolic disturbance: Secondary | ICD-10-CM | POA: Diagnosis present

## 2014-05-28 DIAGNOSIS — O210 Mild hyperemesis gravidarum: Secondary | ICD-10-CM | POA: Diagnosis present

## 2014-05-28 HISTORY — DX: Hyperemesis gravidarum with metabolic disturbance: O21.1

## 2014-05-28 LAB — COMPREHENSIVE METABOLIC PANEL
ALBUMIN: 4.5 g/dL (ref 3.5–5.2)
ALT: 16 U/L (ref 0–35)
AST: 21 U/L (ref 0–37)
Alkaline Phosphatase: 45 U/L (ref 39–117)
Anion gap: 13 (ref 5–15)
BUN: 11 mg/dL (ref 6–23)
CALCIUM: 9.7 mg/dL (ref 8.4–10.5)
CO2: 22 mmol/L (ref 19–32)
CREATININE: 0.9 mg/dL (ref 0.50–1.10)
Chloride: 101 mmol/L (ref 96–112)
GFR calc Af Amer: >90 mL/min (ref >90)
GFR calc non Af Amer: 83 mL/min — ABNORMAL LOW (ref >90)
Glucose, Bld: 110 mg/dL — ABNORMAL HIGH (ref 70–99)
Potassium: 3 mmol/L — ABNORMAL LOW (ref 3.5–5.1)
Sodium: 136 mmol/L (ref 135–145)
Total Bilirubin: 0.6 mg/dL (ref 0.3–1.2)
Total Protein: 8.4 g/dL — ABNORMAL HIGH (ref 6.0–8.3)

## 2014-05-28 LAB — URINE MICROSCOPIC-ADD ON

## 2014-05-28 LAB — URINALYSIS, ROUTINE W REFLEX MICROSCOPIC
Glucose, UA: NEGATIVE mg/dL
Ketones, ur: >80 mg/dL — AB
Leukocytes, UA: NEGATIVE
NITRITE: NEGATIVE
Protein, ur: 30 mg/dL — AB
Urobilinogen, UA: 0.2 mg/dL (ref 0.0–1.0)
pH: 6 (ref 5.0–8.0)

## 2014-05-28 LAB — CBC
HCT: 37.4 % (ref 36.0–46.0)
Hemoglobin: 12.4 g/dL (ref 12.0–15.0)
MCH: 24.9 pg — AB (ref 26.0–34.0)
MCHC: 33.2 g/dL (ref 30.0–36.0)
MCV: 75.3 fL — ABNORMAL LOW (ref 78.0–100.0)
Platelets: 235 10*3/uL (ref 150–400)
RBC: 4.97 MIL/uL (ref 3.87–5.11)
RDW: 16.5 % — ABNORMAL HIGH (ref 11.5–15.5)
WBC: 7.7 10*3/uL (ref 4.0–10.5)

## 2014-05-28 LAB — TSH: TSH: 1.036 u[IU]/mL (ref 0.350–4.500)

## 2014-05-28 MED ORDER — POTASSIUM CHLORIDE 10 MEQ/100ML IV SOLN
10.0000 meq | INTRAVENOUS | Status: AC
  Administered 2014-05-28 – 2014-05-29 (×5): 10 meq via INTRAVENOUS
  Filled 2014-05-28 (×6): qty 100

## 2014-05-28 MED ORDER — ZOLPIDEM TARTRATE 5 MG PO TABS: 5.0000 mg | ORAL_TABLET | Freq: Every evening | ORAL | Status: DC | PRN

## 2014-05-28 MED ORDER — ACETAMINOPHEN 325 MG PO TABS: 650.0000 mg | ORAL_TABLET | ORAL | Status: DC | PRN

## 2014-05-28 MED ORDER — SODIUM CHLORIDE 0.9 % IV SOLN
INTRAVENOUS | Status: AC
  Administered 2014-05-28 (×2): via INTRAVENOUS

## 2014-05-28 MED ORDER — POTASSIUM CHLORIDE 2 MEQ/ML IV SOLN
INTRAVENOUS | Status: DC
  Administered 2014-05-29 – 2014-06-01 (×9): via INTRAVENOUS
  Filled 2014-05-28 (×13): qty 1000

## 2014-05-28 MED ORDER — CALCIUM CARBONATE ANTACID 500 MG PO CHEW: 2.0000 | CHEWABLE_TABLET | ORAL | Status: DC | PRN

## 2014-05-28 MED ORDER — ENSURE ENLIVE PO LIQD
237.0000 mL | Freq: Three times a day (TID) | ORAL | Status: DC
  Administered 2014-05-28 – 2014-05-31 (×10): 237 mL via ORAL
  Filled 2014-05-28 (×13): qty 237

## 2014-05-28 MED ORDER — DOCUSATE SODIUM 100 MG PO CAPS
100.0000 mg | ORAL_CAPSULE | Freq: Every day | ORAL | Status: DC
  Administered 2014-05-29 – 2014-06-01 (×4): 100 mg via ORAL
  Filled 2014-05-28 (×4): qty 1

## 2014-05-28 MED ORDER — SODIUM CHLORIDE 0.9 % IV SOLN
INTRAVENOUS | Status: DC
  Filled 2014-05-28 (×2): qty 1000

## 2014-05-28 MED ORDER — POTASSIUM CHLORIDE IN NACL 20-0.9 MEQ/L-% IV SOLN: INTRAVENOUS | Status: DC

## 2014-05-28 MED ORDER — ONDANSETRON 4 MG PO TBDP
4.0000 mg | ORAL_TABLET | Freq: Two times a day (BID) | ORAL | Status: DC
  Administered 2014-05-28: 4 mg via ORAL
  Filled 2014-05-28: qty 1

## 2014-05-28 MED ORDER — LACTATED RINGERS IV BOLUS (SEPSIS)
2000.0000 mL | Freq: Once | INTRAVENOUS | Status: AC
  Administered 2014-05-28: 2000 mL via INTRAVENOUS

## 2014-05-28 MED ORDER — METOCLOPRAMIDE HCL 5 MG/ML IJ SOLN
5.0000 mg | Freq: Four times a day (QID) | INTRAMUSCULAR | Status: DC
  Administered 2014-05-28 – 2014-05-31 (×11): 5 mg via INTRAVENOUS
  Filled 2014-05-28 (×11): qty 2

## 2014-05-28 MED ORDER — LACTATED RINGERS IV SOLN: INTRAVENOUS | Status: DC

## 2014-05-28 MED ORDER — PRENATAL MULTIVITAMIN CH
1.0000 | ORAL_TABLET | Freq: Every day | ORAL | Status: DC
  Administered 2014-05-29: 1 via ORAL
  Filled 2014-05-28: qty 1

## 2014-05-28 MED ORDER — FAMOTIDINE IN NACL 20-0.9 MG/50ML-% IV SOLN
20.0000 mg | Freq: Two times a day (BID) | INTRAVENOUS | Status: DC
  Administered 2014-05-29 – 2014-05-31 (×5): 20 mg via INTRAVENOUS
  Filled 2014-05-28 (×6): qty 50

## 2014-05-28 MED ORDER — PROMETHAZINE HCL 25 MG/ML IJ SOLN
12.5000 mg | Freq: Four times a day (QID) | INTRAMUSCULAR | Status: DC
  Administered 2014-05-29 – 2014-05-31 (×10): 12.5 mg via INTRAVENOUS
  Filled 2014-05-28 (×10): qty 1

## 2014-05-28 NOTE — MAU Provider Note (Signed)
History   G2P1 @ 8.3 wks in with nausea and vomiting. States has thrown up 3 times today, has no appatite and has not had BM in 2 wks she states. Stated she has no medical problems and this was not a issure with her 1st pregnancy. Taking phenergan but is not helping.  CSN: [id number]  Arrival date and time: 05/28/14 1417   First Provider Initiated Contact with Patient 05/28/14 1531      Chief Complaint  Patient presents with  . Emesis During Pregnancy   HPI  OB History    Gravida Para Term Preterm AB TAB SAB Ectopic Multiple Living   2 1 1  0 0 0 0 0 0 1      Past Medical History  Diagnosis Date  . HSV-2 (herpes simplex virus 2) infection   . Allergy   . Fibroid   . Infection     UTI    Past Surgical History  Procedure Laterality Date  . Wisdom tooth extraction  2000    Family History  Problem Relation Age of Onset  . Diabetes Father   . Asthma Brother   . Diabetes Brother   . Early death Brother   . Drug abuse Maternal Aunt   . Drug abuse Paternal Uncle   . Alcohol abuse Maternal Grandmother   . Asthma Maternal Grandmother   . Alzheimer's disease Maternal Grandmother   . Alcohol abuse Maternal Grandfather   . Asthma Maternal Grandfather   . Alcohol abuse Paternal Grandmother   . Diabetes Paternal Grandmother   . Alcohol abuse Paternal Grandfather     History  Substance Use Topics  . Smoking status: Never Smoker   . Smokeless tobacco: Never Used  . Alcohol Use: No    Allergies:  Allergies  Allergen Reactions  . Latex Itching    Prescriptions prior to admission  Medication Sig Dispense Refill Last Dose  . Prenat w/o A-FeCbn-Meth-FA-DHA (PRENATE MINI) 29-0.6-0.4-350 MG CAPS Take 1 capsule by mouth daily. 30 capsule 11 Past Month at Unknown time  . promethazine (PHENERGAN) 25 MG tablet Take 0.5-1 tablets (12.5-25 mg total) by mouth every 6 (six) hours as needed. (Patient taking differently: Take 12.5-25 mg by mouth every 6 (six) hours as needed for  nausea or vomiting. ) 30 tablet 0 Past Week at Unknown time  . valACYclovir (VALTREX) 500 MG tablet Take 2 tablets ( 1000mg  ) po bid x 3 days prn each outbreak. (Patient taking differently: Take 500 mg by mouth as needed (outbreaks). Take 2 tablets ( 1000mg  ) po bid x 3 days prn each outbreak.) 30 tablet prn more than one month    Review of Systems  Constitutional: Positive for weight loss.  HENT: Negative.   Eyes: Negative.   Respiratory: Negative.   Cardiovascular: Negative.   Gastrointestinal: Positive for nausea, vomiting and constipation.  Genitourinary: Negative.   Musculoskeletal: Negative.   Skin: Negative.   Neurological: Positive for weakness.  Endo/Heme/Allergies: Negative.   Psychiatric/Behavioral: Negative.    Physical Exam   Blood pressure 122/85, pulse 88, resp. rate 18, height 5\' 4"  (1.626 m), weight 265 lb 6 oz (120.373 kg), last menstrual period 03/30/2014, SpO2 100 %.  Physical Exam  Constitutional: She is oriented to person, place, and time. She appears well-developed and well-nourished.  HENT:  Head: Normocephalic.  Neck: Normal range of motion.  Cardiovascular: Normal rate, regular rhythm, normal heart sounds and intact distal pulses.   Respiratory: Effort normal and breath sounds normal.  GI: Soft.  Bowel sounds are normal.  Genitourinary: Uterus normal.  Musculoskeletal: Normal range of motion.  Neurological: She is alert and oriented to person, place, and time. She has normal reflexes.  Skin: Skin is warm and dry.  Psychiatric: She has a normal mood and affect. Her behavior is normal. Judgment and thought content normal.    MAU Course  Procedures  MDM Nausea and vomiting of early pregnancy.  Assessment and Plan  Nausea and vomiting of early pregnancy. In review of chart pt has lost 30 lbs in 3 wks from 1 office visit until today. CBC, CMP.Will try po zofran if retains po's will d/c home to take LOC and to start stool softners. Will consult Dr.  Jodi Mourning.  Koren Shiver DARLENE 05/28/2014, 3:35 PM

## 2014-05-28 NOTE — MAU Provider Note (Signed)
S: feeling no better. O:hypokalemia, dehydration, VSS at present A: hyperemesis,hypokalemia P: consulted Dr.Trecia Maring for admission.

## 2014-05-28 NOTE — MAU Note (Signed)
On promethazine, not helping at all.  Thinks are getting any better.

## 2014-05-28 NOTE — MAU Note (Addendum)
Vomiting and have not eating in 2wks. Cant keep food or drink down. Last full meal was 2weeks ago. Been sipping on Gatorade. Called office but office was closed and call line person told her to come to MAU. Have to spit all the time feels like it just builds up in her mouth and she can not swallow it .have not had bowl movement in 2weeks

## 2014-05-28 NOTE — H&P (Signed)
Brittney Mcbride is a 34 y.o. female presenting for N / V, dehydration and electrolyte imbalance . Maternal Medical History:  Reason for admission: Nausea.   Prenatal complications: Hyperemesis   Prenatal Complications - Diabetes: none.    OB History    Gravida Para Term Preterm AB TAB SAB Ectopic Multiple Living   2 1 1  0 0 0 0 0 0 1     Past Medical History  Diagnosis Date  . HSV-2 (herpes simplex virus 2) infection   . Allergy   . Fibroid   . Infection     UTI   Past Surgical History  Procedure Laterality Date  . Wisdom tooth extraction  2000   Family History: family history includes Alcohol abuse in her maternal grandfather, maternal grandmother, paternal grandfather, and paternal grandmother; Alzheimer's disease in her maternal grandmother; Asthma in her brother, maternal grandfather, and maternal grandmother; Diabetes in her brother, father, and paternal grandmother; Drug abuse in her maternal aunt and paternal uncle; Early death in her brother. Social History:  reports that she has never smoked. She has never used smokeless tobacco. She reports that she does not drink alcohol or use illicit drugs.   Prenatal Transfer Tool  Maternal Diabetes: No Genetic Screening: Pending Maternal Ultrasounds/Referrals: Normal Fetal Ultrasounds or other Referrals:  None Maternal Substance Abuse:  No Significant Maternal Medications:  None Significant Maternal Lab Results:  None Other Comments:  None  Review of Systems  Gastrointestinal: Positive for nausea and vomiting.      Blood pressure 122/85, pulse 88, resp. rate 18, height 5\' 4"  (1.626 m), weight 265 lb 6 oz (120.373 kg), last menstrual period 03/30/2014, SpO2 100 %. Maternal Exam:  Abdomen: Patient reports no abdominal tenderness.   Physical Exam  Nursing note and vitals reviewed. Constitutional: She is oriented to person, place, and time. She appears well-developed and well-nourished.  HENT:  Head: Normocephalic and  atraumatic.  Eyes: Conjunctivae are normal. Pupils are equal, round, and reactive to light.  Neck: Normal range of motion. Neck supple.  Cardiovascular: Normal rate and regular rhythm.   Respiratory: Effort normal and breath sounds normal.  GI: Soft. Bowel sounds are normal.  Neurological: She is alert and oriented to person, place, and time.  Skin: Skin is warm and dry.  Psychiatric: She has a normal mood and affect. Her behavior is normal. Judgment and thought content normal.    Prenatal labs: ABO, Rh: O/POS/-- (03/02 1435) Antibody: NEG (03/02 1435) Rubella: 1.95 (03/02 1435) RPR: NON REAC (03/02 1435)  HBsAg: NEGATIVE (03/02 1435)  HIV: NONREACTIVE (03/02 1435)  GBS:     Assessment/Plan: 8.3 weeks.  Hyperemesis Gravidarum.  Dehydration.  Electrolyte imbalance.  Admit.  IV hydration and supportive management.   HARPER,CHARLES A 05/28/2014, 6:04 PM

## 2014-05-28 NOTE — MAU Provider Note (Signed)
Talked with Dr. Jodi Mourning and given pt status and progress report, POC discussed. Will assess labs and if retains fluids and labs are stable will d/c home. If not will notrify MD.

## 2014-05-29 MED ORDER — SODIUM CHLORIDE 0.9 % IV SOLN
Freq: Once | INTRAVENOUS | Status: AC
  Administered 2014-05-29: 04:00:00 via INTRAVENOUS

## 2014-05-29 MED ORDER — POTASSIUM CHLORIDE 10 MEQ/100ML IV SOLN
10.0000 meq | Freq: Once | INTRAVENOUS | Status: AC
  Administered 2014-05-29: 10 meq via INTRAVENOUS
  Filled 2014-05-29: qty 100

## 2014-05-29 NOTE — Progress Notes (Signed)
INITIAL NUTRITION ASSESSMENT  DOCUMENTATION CODES Per approved criteria  -Severe malnutrition in the context of acute illness or injury   INTERVENTION: C/L diet advanced  as tol Ensure Enlive TID  NUTRITION DIAGNOSIS: Inadequate oral intake  related to hyperemesis  as evidenced by weight los, n/v.   Goal: tol of po diet  Monitor:  Diet tol, weight trend  Reason for Assessment: Hyperemesis  34 y.o. female  Admitting Dx: <principal problem not specified>  ASSESSMENT: Pt tolerated Ensure supplement this morning without vomiting . States she is hungry. Reports that she has allterations in taste. Also reports that the majority of the 30 Lb weight loss occurred in the past 2 weeks.  Meets ASPEN criteria for Dx of malnutrition based on a > 5 % loss of usual weight in past month and po intke the is < 50 % of est needs for > 5 days   Height: Ht Readings from Last 1 Encounters:  05/28/14 5\' 4"  (1.626 m)    Weight: Wt Readings from Last 1 Encounters:  05/29/14 270 lb (122.471 kg)    Ideal Body Weight: 120 lbs  % Ideal Body Weight: 225%  Wt Readings from Last 10 Encounters:  05/29/14 270 lb (122.471 kg)  05/17/14 278 lb (126.1 kg)  05/02/14 292 lb (132.45 kg)  06/01/13 304 lb (137.893 kg)  03/29/13 290 lb (131.543 kg)  03/10/13 296 lb (134.265 kg)  01/25/13 293 lb (132.904 kg)  01/15/13 287 lb (130.182 kg)  01/12/13 297 lb (134.718 kg)  06/30/12 304 lb (137.893 kg)    Usual Body Weight: 300 lbs  % Usual Body Weight: 90%  BMI:  Body mass index is 46.32 kg/(m^2).  Estimated Nutritional Needs: Kcal: 2100-2200 Protein: 90-100 Fluid: 2.3   Diet Order: Diet clear liquid Room service appropriate?: Yes; Fluid consistency:: Thin  EDUCATION NEEDS: -No education needs identified at this time. Reviewed the diet for hyperemesis with pt: small frequent meals of low fat foods. Encouraged Ensure, Boost or carnation instant breakfast supplements to consume after  discharge   Intake/Output Summary (Last 24 hours) at 05/29/14 1001 Last data filed at 05/29/14 2952  Gross per 24 hour  Intake 2065.42 ml  Output    425 ml  Net 1640.42 ml    Labs:   Recent Labs Lab 05/28/14 1605  NA 136  K 3.0*  CL 101  CO2 22  BUN 11  CREATININE 0.90  CALCIUM 9.7  GLUCOSE 110*    CBG (last 3)  No results for input(s): GLUCAP in the last 72 hours.  Scheduled Meds: . docusate sodium  100 mg Oral Daily  . famotidine  20 mg Intravenous Q12H  . feeding supplement (ENSURE ENLIVE)  237 mL Oral TID BM  . metoCLOPramide  5 mg Intravenous Q6H  . prenatal multivitamin  1 tablet Oral Q1200  . promethazine  12.5 mg Intravenous 4 times per day    Continuous Infusions: . 0.9 % sodium chloride with kcl 125 mL/hr at 05/29/14 8413    Past Medical History  Diagnosis Date  . HSV-2 (herpes simplex virus 2) infection   . Allergy   . Fibroid   . Infection     UTI    Past Surgical History  Procedure Laterality Date  . Wisdom tooth extraction  Sylva M.Fredderick Severance LDN Neonatal Nutrition Support Specialist/RD III Pager [phone]

## 2014-05-29 NOTE — Progress Notes (Signed)
Patient ID: Brittney Mcbride, female   DOB: June 07, 1980, 34 y.o.   MRN: [id number] Hospital Day: 2  S: Less nausea.  No vomiting since admission.  O: Blood pressure 112/64, pulse 63, temperature 98.4 F (36.9 C), temperature source Oral, resp. rate 18, height 5\' 4"  (1.626 m), weight 270 lb (122.471 kg), last menstrual period 03/30/2014, SpO2 100 %.   FHT:  Absent Toco: None SVE:   A/P- 34 y.o. admitted with:  Nausea, vomiting and dehydration with electrolyte imbalance.  Significant weight loss of ~ 30 lbs.  Admitted for IVF hydration and supportive management.  Present on Admission:  . Hypokalemia . Dehydration . Hyperemesis gravidarum with electrolyte imbalance  Pregnancy Complications: Hyperemesis  Preterm labor management: no treatment necessary Dating:  [redacted]w[redacted]d PNL Needed:  K+ FWB:  good PTL:  none

## 2014-05-30 NOTE — Progress Notes (Signed)
Patient ID: EBONEY CLAYBROOK, female   DOB: Dec 09, 1980, 34 y.o.   MRN: [id number] Hospital Day: 3  S: Less nausea.  No vomiting.  Slowly tolerating diet.  O: Blood pressure 106/63, pulse 77, temperature 98.7 F (37.1 C), temperature source Oral, resp. rate 18, height 5\' 4"  (1.626 m), weight 279 lb 12 oz (126.894 kg), last menstrual period 03/30/2014, SpO2 99 %.   NTZ:GYFV Toco: None SVE:   A/P- 34 y.o. admitted with:  Present on Admission:  . Hypokalemia . Dehydration . Hyperemesis gravidarum with electrolyte imbalance  Pregnancy Complications: Hyperemesis  Preterm labor management: no treatment necessary Dating:  [redacted]w[redacted]d PNL Needed:  K+ FWB:  good PTL:  none

## 2014-05-31 LAB — COMPREHENSIVE METABOLIC PANEL
ALK PHOS: 32 U/L — AB (ref 39–117)
ALT: 11 U/L (ref 0–35)
AST: 19 U/L (ref 0–37)
Albumin: 3.2 g/dL — ABNORMAL LOW (ref 3.5–5.2)
Anion gap: 4 — ABNORMAL LOW (ref 5–15)
BUN: 8 mg/dL (ref 6–23)
CALCIUM: 8.4 mg/dL (ref 8.4–10.5)
CO2: 25 mmol/L (ref 19–32)
Chloride: 107 mmol/L (ref 96–112)
Creatinine, Ser: 0.65 mg/dL (ref 0.50–1.10)
GFR calc Af Amer: >90 mL/min (ref >90)
GFR calc non Af Amer: >90 mL/min (ref >90)
GLUCOSE: 113 mg/dL — AB (ref 70–99)
Potassium: 3.6 mmol/L (ref 3.5–5.1)
SODIUM: 136 mmol/L (ref 135–145)
TOTAL PROTEIN: 5.9 g/dL — AB (ref 6.0–8.3)
Total Bilirubin: 0.3 mg/dL (ref 0.3–1.2)

## 2014-05-31 MED ORDER — PROMETHAZINE HCL 25 MG PO TABS: 12.5000 mg | ORAL_TABLET | Freq: Four times a day (QID) | ORAL | Status: DC | PRN

## 2014-05-31 MED ORDER — METOCLOPRAMIDE HCL 10 MG PO TABS
10.0000 mg | ORAL_TABLET | Freq: Three times a day (TID) | ORAL | Status: DC
  Administered 2014-05-31 – 2014-06-01 (×3): 10 mg via ORAL
  Filled 2014-05-31 (×3): qty 1

## 2014-05-31 MED ORDER — VITAMIN B-6 50 MG PO TABS
50.0000 mg | ORAL_TABLET | Freq: Two times a day (BID) | ORAL | Status: DC
  Administered 2014-06-01: 50 mg via ORAL
  Filled 2014-05-31 (×4): qty 1

## 2014-05-31 MED ORDER — DOXYLAMINE SUCCINATE (SLEEP) 25 MG PO TABS
25.0000 mg | ORAL_TABLET | Freq: Two times a day (BID) | ORAL | Status: DC
  Administered 2014-06-01: 25 mg via ORAL
  Filled 2014-05-31 (×4): qty 1

## 2014-05-31 MED ORDER — FAMOTIDINE 20 MG PO TABS
20.0000 mg | ORAL_TABLET | Freq: Two times a day (BID) | ORAL | Status: DC
  Administered 2014-05-31 – 2014-06-01 (×3): 20 mg via ORAL
  Filled 2014-05-31 (×3): qty 1

## 2014-05-31 MED ORDER — PHENOL 1.4 % MT LIQD
1.0000 | OROMUCOSAL | Status: DC | PRN
  Administered 2014-05-31: 1 via OROMUCOSAL
  Filled 2014-05-31: qty 177

## 2014-05-31 NOTE — Progress Notes (Signed)
DR HARPER notified  That  MEDICATION he  wantd pt to take from his office has not arrived  Yet   He states if it does not arrive tonight  He will bring  It tommorrow

## 2014-05-31 NOTE — Progress Notes (Signed)
Patient ID: KANOE WANNER, female   DOB: 1980/03/14, 34 y.o.   MRN: [id number] Hospital Day: 4  S: Less nausea.  Slowly tolerating diet.  O: Blood pressure 101/59, pulse 77, temperature 98.2 F (36.8 C), temperature source Oral, resp. rate 18, height 5\' 4"  (1.626 m), weight 291 lb (131.997 kg), last menstrual period 03/30/2014, SpO2 100 %.   LPF:[id number] Toco: None SVE:   A/P- 34 y.o. admitted with: Hyperemesis with electrolyte imbalance.  Improved.  Continue current care.  Check CMET.   Present on Admission:  . Hypokalemia . Dehydration . Hyperemesis gravidarum with electrolyte imbalance  Pregnancy Complications: Hyperemesis  Preterm labor management: no treatment necessary Dating:  [redacted]w[redacted]d PNL Needed:  none FWB:  good PTL:  none

## 2014-05-31 NOTE — Care Management Note (Unsigned)
    Page 1 of 1   05/31/2014     2:56:41 PM CARE MANAGEMENT NOTE 05/31/2014  Patient:  NATSHA, GUIDRY   Account Number:  [id number]  Date Initiated:  05/31/2014  Documentation initiated by:  CRAFT,TERRI  Subjective/Objective Assessment:   34 year old female admitted 05/28/14 with hyperemesis     Action/Plan:   D/C when mediically stable.   Anticipated DC Date:  06/03/2014   Anticipated DC Plan:  [place] [facility]  CM consult                Status of service:  In process, will continue to follow  Per UR Regulation:  Reviewed for med. necessity/level of care/duration of stay   Comments:  05/31/14, Aida Raider RNC-MNN, BSN, [phone], CM received consult from pt's RN regarding Reglan pump for pt at discharge.  CM spoke with Cyril Mourning at [facility] and Reglan pump is not covered by Medicaid, will cost pt. $450.00 a week.  Pt is unable to afford this.  CM spoke with Kandis Cocking, CNM, to update.  Will follow and assist as needed.

## 2014-05-31 NOTE — Progress Notes (Signed)
05/31/14, Aida Raider RNC-MNN, BSN, [phone], CM received PC from [place] concerning need for Reglan pump.  Pt has failed with po medications and has lost 30 pounds.  CM called Kristen at [facility] and she suggested Dr. Jodi Mourning call Claire Shown, pharmacy clinical manager at [facility], [phone], for assistance.  This information given to Dr. Jodi Mourning.  Will continue to follow.

## 2014-06-01 MED ORDER — METOCLOPRAMIDE HCL 10 MG PO TABS: 10.0000 mg | ORAL_TABLET | Freq: Three times a day (TID) | ORAL | Status: DC

## 2014-06-01 MED ORDER — DOCUSATE SODIUM 100 MG PO CAPS: 100.0000 mg | ORAL_CAPSULE | Freq: Every day | ORAL | Status: DC

## 2014-06-01 MED ORDER — FAMOTIDINE 20 MG PO TABS: 20.0000 mg | ORAL_TABLET | Freq: Two times a day (BID) | ORAL | Status: DC

## 2014-06-01 MED ORDER — ENSURE ENLIVE PO LIQD
237.0000 mL | Freq: Two times a day (BID) | ORAL | Status: DC
Start: 2014-06-01 — End: 2014-12-27

## 2014-06-01 MED ORDER — PROMETHAZINE HCL 25 MG PO TABS: 25.0000 mg | ORAL_TABLET | Freq: Three times a day (TID) | ORAL | Status: DC | PRN

## 2014-06-01 NOTE — Progress Notes (Signed)
Pt is discharged in the care of [place] R,N.  ESCORT. No equipment needed for home use. Stable.  Denies any pain,vaginaL bleeding ,nausea or vomiting. Discharge instructions with Rx were given to pt. Questions were asked and answered.

## 2014-06-01 NOTE — Progress Notes (Signed)
06/01/14, Aida Raider RNC-MNN, BSN, [phone], PC from Dr. Jodi Mourning 05/31/14 concerning Reglan pump for this pt.  Dr. Jodi Mourning given. Pharmacy Manager's name and number at [facility].  Decsion was made by Dr. Jodi Mourning to send pt home on po meds at this time.  If pt returns, will need Reglan pump.  This information shared with Volanda Napoleon at [facility], and [place], [place].

## 2014-06-01 NOTE — Progress Notes (Signed)
Patient ID: Brittney Mcbride, female   DOB: 1980-04-20, 34 y.o.   MRN: [id number] Hospital Day: 5  S: Tolerating diet.  O: Blood pressure 93/63, pulse 99, temperature 98.8 F (37.1 C), temperature source Oral, resp. rate 18, height 5\' 4"  (1.626 m), weight 297 lb (134.718 kg), last menstrual period 03/30/2014, SpO2 99 %.   VHQ:IONG Toco: None SVE:   A/P- 34 y.o. admitted with: Nausea, vomiting, dehydration and electrolyte imbalance.  Responded well to therapy.  Discharged home.  Present on Admission:  . Hypokalemia . Dehydration . Hyperemesis gravidarum with electrolyte imbalance  Pregnancy Complications: Hyperemesis  Preterm labor management: no treatment necessary Dating:  [redacted]w[redacted]d PNL Needed:  none FWB:  good PTL:  none

## 2014-06-01 NOTE — Discharge Summary (Signed)
Physician Discharge Summary  Patient ID: Brittney Mcbride MRN: [id number] DOB/AGE: January 05, 1981 34 y.o.  Admit date: 05/28/2014 Discharge date: 06/01/2014  Admission Diagnoses: Hyperemesis Gravidarum  Discharge Diagnoses: Same Active Problems:   Hypokalemia   Dehydration   Hyperemesis gravidarum with electrolyte imbalance   Discharged Condition: good  Hospital Course: Admitted with N/V, dehydration and electrolyte imbalance  Responded well to therapy.  Discharged home in good condition.  Consults: Nutrition  Significant Diagnostic Studies: labs: CBC, CMET, TSH  Treatments: IV hydration and antiemetics.  Nutritional support.  Discharge Exam: Blood pressure 93/63, pulse 99, temperature 98.8 F (37.1 C), temperature source Oral, resp. rate 18, height 5\' 4"  (1.626 m), weight 297 lb (134.718 kg), last menstrual period 03/30/2014, SpO2 99 %. General appearance: alert and no distress Cardio: regular rate and rhythm, S1, S2 normal, no murmur, click, rub or gallop GI: normal findings: soft, non-tender Extremities: extremities normal, atraumatic, no cyanosis or edema  Disposition: 01-Home or Self Care     Medication List    STOP taking these medications        PRENATE MINI 29-0.6-0.4-350 MG Caps      TAKE these medications        docusate sodium 100 MG capsule  Commonly known as:  COLACE  Take 1 capsule (100 mg total) by mouth at bedtime.     famotidine 20 MG tablet  Commonly known as:  PEPCID  Take 1 tablet (20 mg total) by mouth 2 (two) times daily.     feeding supplement (ENSURE ENLIVE) Liqd  Take 237 mLs by mouth 2 (two) times daily between meals.     metoCLOPramide 10 MG tablet  Commonly known as:  REGLAN  Take 1 tablet (10 mg total) by mouth 4 (four) times daily -  before meals and at bedtime.     promethazine 25 MG tablet  Commonly known as:  PHENERGAN  Take 1 tablet (25 mg total) by mouth every 8 (eight) hours as needed for nausea or vomiting.     valACYclovir  500 MG tablet  Commonly known as:  VALTREX  Take 2 tablets ( 1000mg  ) po bid x 3 days prn each outbreak.           Follow-up Information    Follow up with Morene Crocker, CNM. Schedule an appointment as soon as possible for a visit in 2 weeks.   Specialty:  Certified Nurse Midwife   Contact information:   [address] [place] [address] [phone]       Signed: HARPER,CHARLES A 06/01/2014, 10:18 AM

## 2014-06-20 ENCOUNTER — Encounter: Payer: Self-pay | Admitting: Certified Nurse Midwife

## 2014-06-20 ENCOUNTER — Ambulatory Visit (INDEPENDENT_AMBULATORY_CARE_PROVIDER_SITE_OTHER): Payer: Medicaid Other | Admitting: Certified Nurse Midwife

## 2014-06-20 VITALS — BP 133/95 | HR 75 | Temp 99.1°F | Wt 277.0 lb

## 2014-06-20 DIAGNOSIS — Z3481 Encounter for supervision of other normal pregnancy, first trimester: Secondary | ICD-10-CM | POA: Diagnosis not present

## 2014-06-20 DIAGNOSIS — R1115 Cyclical vomiting syndrome unrelated to migraine: Secondary | ICD-10-CM

## 2014-06-20 DIAGNOSIS — N760 Acute vaginitis: Secondary | ICD-10-CM

## 2014-06-20 DIAGNOSIS — B373 Candidiasis of vulva and vagina: Secondary | ICD-10-CM

## 2014-06-20 DIAGNOSIS — A499 Bacterial infection, unspecified: Secondary | ICD-10-CM

## 2014-06-20 DIAGNOSIS — G43A1 Cyclical vomiting, intractable: Secondary | ICD-10-CM

## 2014-06-20 DIAGNOSIS — B3731 Acute candidiasis of vulva and vagina: Secondary | ICD-10-CM

## 2014-06-20 DIAGNOSIS — B9689 Other specified bacterial agents as the cause of diseases classified elsewhere: Secondary | ICD-10-CM

## 2014-06-20 DIAGNOSIS — O2690 Pregnancy related conditions, unspecified, unspecified trimester: Secondary | ICD-10-CM | POA: Diagnosis not present

## 2014-06-20 LAB — POCT URINALYSIS DIPSTICK
Bilirubin, UA: NEGATIVE
Blood, UA: 50
Glucose, UA: NEGATIVE
Leukocytes, UA: NEGATIVE
Nitrite, UA: NEGATIVE
Spec Grav, UA: 1.02
UROBILINOGEN UA: NEGATIVE
pH, UA: 6

## 2014-06-20 LAB — OB RESULTS CONSOLE GC/CHLAMYDIA
Chlamydia: NEGATIVE
Gonorrhea: NEGATIVE

## 2014-06-20 MED ORDER — MICONAZOLE NITRATE 1200 & 2 MG & % VA KIT: 1.0000 | PACK | Freq: Once | VAGINAL | Status: DC

## 2014-06-20 MED ORDER — DOXYLAMINE-PYRIDOXINE 10-10 MG PO TBEC: 1.0000 | DELAYED_RELEASE_TABLET | Freq: Three times a day (TID) | ORAL | Status: DC

## 2014-06-20 MED ORDER — METRONIDAZOLE 0.75 % VA GEL: 1.0000 | Freq: Two times a day (BID) | VAGINAL | Status: AC

## 2014-06-20 NOTE — Progress Notes (Signed)
Subjective:    Brittney Mcbride is being seen today for her first obstetrical visit.  This is not a planned pregnancy. She is at [redacted]w[redacted]d gestation. Her obstetrical history is significant for obesity and hyperemesis. Relationship with FOB: spouse, living together. Patient does intend to breast feed. Pregnancy history fully reviewed.  Last episode of vomiting was Monday, she attributed that to the reglan.  Is only taking Pepcid for N&V since Monday.  Desires Diclegis to help with the hyperemesis. Reports regularly eating breakfast and dinner with a small midmorning snack.  Does not have much of an appetite.  Does use Ensure.    The information documented in the HPI was reviewed and verified.  Menstrual History: OB History    Gravida Para Term Preterm AB TAB SAB Ectopic Multiple Living   2 1 1  0 0 0 0 0 0 1      Menarche age: 54  Patient's last menstrual period was 03/30/2014.    Past Medical History  Diagnosis Date  . HSV-2 (herpes simplex virus 2) infection   . Allergy   . Fibroid   . Infection     UTI    Past Surgical History  Procedure Laterality Date  . Wisdom tooth extraction  2000     (Not in a hospital admission) Allergies  Allergen Reactions  . Latex Itching    History  Substance Use Topics  . Smoking status: Never Smoker   . Smokeless tobacco: Never Used  . Alcohol Use: No    Family History  Problem Relation Age of Onset  . Diabetes Father   . Asthma Brother   . Diabetes Brother   . Early death Brother   . Drug abuse Maternal Aunt   . Drug abuse Paternal Uncle   . Alcohol abuse Maternal Grandmother   . Asthma Maternal Grandmother   . Alzheimer's disease Maternal Grandmother   . Alcohol abuse Maternal Grandfather   . Asthma Maternal Grandfather   . Alcohol abuse Paternal Grandmother   . Diabetes Paternal Grandmother   . Alcohol abuse Paternal Grandfather      Review of Systems Constitutional: + for weight loss Gastrointestinal: + for nausea &  vomiting Genitourinary:negative for genital lesions and vaginal discharge and dysuria Musculoskeletal:negative for back pain Behavioral/Psych: negative for abusive relationship, depression, illegal drug usage and tobacco use    Objective:    BP 133/95 mmHg  Pulse 75  Temp(Src) 99.1 F (37.3 C)  Wt 125.646 kg (277 lb)  LMP 03/30/2014 General Appearance:    Alert, cooperative, no distress, appears stated age  Head:    Normocephalic, without obvious abnormality, atraumatic  Eyes:    PERRL, conjunctiva/corneas clear, EOM's intact, fundi    benign, both eyes  Ears:    Normal TM's and external ear canals, both ears  Nose:   Nares normal, septum midline, mucosa normal, no drainage    or sinus tenderness  Throat:   Lips, mucosa, and tongue normal; teeth and gums normal  Neck:   Supple, symmetrical, trachea midline, no adenopathy;    thyroid:  no enlargement/tenderness/nodules; no carotid   bruit or JVD  Back:     Symmetric, no curvature, ROM normal, no CVA tenderness  Lungs:     Clear to auscultation bilaterally, respirations unlabored  Chest Wall:    No tenderness or deformity   Heart:    Regular rate and rhythm, S1 and S2 normal, no murmur, rub   or gallop  Breast Exam:    No  tenderness, masses, or nipple abnormality  Abdomen:     Soft, non-tender, bowel sounds active all four quadrants,    no masses, no organomegaly  Genitalia:    Normal female without lesion, discharge or tenderness  Extremities:   Extremities normal, atraumatic, no cyanosis or edema  Pulses:   2+ and symmetric all extremities  Skin:   Skin color, texture, turgor normal, no rashes or lesions  Lymph nodes:   Cervical, supraclavicular, and axillary nodes normal  Neurologic:   CNII-XII intact, normal strength, sensation and reflexes    throughout  FHT:  130's with doppler.     Lab Review Urine pregnancy test Labs reviewed yes Radiologic studies reviewed yes Assessment:    Pregnancy at [redacted]w[redacted]d weeks   Hyperemesis  Gravidum   Plan:   Prenatal vitamins.  Counseling provided regarding continued use of seat belts, cessation of alcohol consumption, smoking or use of illicit drugs; infection precautions i.e., influenza/TDAP immunizations, toxoplasmosis,CMV, parvovirus, listeria and varicella; workplace safety, exercise during pregnancy; routine dental care, safe medications, sexual activity, hot tubs, saunas, pools, travel, caffeine use, fish and methlymercury, potential toxins, hair treatments, varicose veins Weight gain recommendations per IOM guidelines reviewed: underweight/BMI< 18.5--> gain 28 - 40 lbs; normal weight/BMI 18.5 - 24.9--> gain 25 - 35 lbs; overweight/BMI 25 - 29.9--> gain 15 - 25 lbs; obese/BMI >30->gain  11 - 20 lbs Problem list reviewed and updated. FIRST/CF mutation testing/NIPT/QUAD SCREEN/fragile X/Ashkenazi Jewish population testing/Spinal muscular atrophy discussed: requested. Role of ultrasound in pregnancy discussed; fetal survey: requested. Amniocentesis discussed: not indicated.  Meds ordered this encounter  Medications  . Doxylamine-Pyridoxine (DICLEGIS) 10-10 MG TBEC    Sig: Take 1 tablet by mouth 3 (three) times daily. 1 tab in AM, 1 tab at lunch, 2 tabs at bedtime.    Dispense:  100 tablet    Refill:  5   Orders Placed This Encounter  Procedures  . Culture, OB Urine  . POCT urinalysis dipstick    Follow up in 4 weeks. 50 % of 30 min visit spent on counseling and coordination of care.

## 2014-06-20 NOTE — Addendum Note (Signed)
Addended by: Bettye Boeck on: 06/20/2014 03:20 PM   Modules accepted: Orders

## 2014-06-22 LAB — CULTURE, OB URINE
Colony Count: NO GROWTH
Organism ID, Bacteria: NO GROWTH

## 2014-06-23 LAB — SURESWAB, VAGINOSIS/VAGINITIS PLUS
Atopobium vaginae: NOT DETECTED Log (cells/mL)
C. ALBICANS, DNA: NOT DETECTED
C. GLABRATA, DNA: NOT DETECTED
C. parapsilosis, DNA: NOT DETECTED
C. trachomatis RNA, TMA: NOT DETECTED
C. tropicalis, DNA: NOT DETECTED
Gardnerella vaginalis: 4.8 Log (cells/mL)
LACTOBACILLUS SPECIES: >8 Log (cells/mL)
MEGASPHAERA SPECIES: NOT DETECTED Log (cells/mL)
N. GONORRHOEAE RNA, TMA: NOT DETECTED
T. vaginalis RNA, QL TMA: NOT DETECTED

## 2014-07-18 ENCOUNTER — Ambulatory Visit (INDEPENDENT_AMBULATORY_CARE_PROVIDER_SITE_OTHER): Payer: Medicaid Other | Admitting: Certified Nurse Midwife

## 2014-07-18 VITALS — BP 117/80 | HR 70 | Temp 98.8°F | Wt 283.0 lb

## 2014-07-18 DIAGNOSIS — Z3482 Encounter for supervision of other normal pregnancy, second trimester: Secondary | ICD-10-CM | POA: Diagnosis not present

## 2014-07-18 LAB — POCT URINALYSIS DIPSTICK
BILIRUBIN UA: NEGATIVE
Glucose, UA: NEGATIVE
KETONES UA: NEGATIVE
Leukocytes, UA: NEGATIVE
Nitrite, UA: NEGATIVE
Protein, UA: NEGATIVE
RBC UA: NEGATIVE
Spec Grav, UA: 1.015
Urobilinogen, UA: NEGATIVE
pH, UA: 7

## 2014-07-18 LAB — CBC
HCT: 31.2 % — ABNORMAL LOW (ref 36.0–46.0)
Hemoglobin: 10 g/dL — ABNORMAL LOW (ref 12.0–15.0)
MCH: 24.1 pg — ABNORMAL LOW (ref 26.0–34.0)
MCHC: 32.1 g/dL (ref 30.0–36.0)
MCV: 75.2 fL — ABNORMAL LOW (ref 78.0–100.0)
MPV: 10.3 fL (ref 8.6–12.4)
Platelets: 236 10*3/uL (ref 150–400)
RBC: 4.15 MIL/uL (ref 3.87–5.11)
RDW: 15.8 % — ABNORMAL HIGH (ref 11.5–15.5)
WBC: 6.7 10*3/uL (ref 4.0–10.5)

## 2014-07-18 NOTE — Progress Notes (Signed)
  Subjective:    Brittney Mcbride is a 34 y.o. female being seen today for her obstetrical visit. She is at [redacted]w[redacted]d gestation. Patient reports: nausea, no bleeding, no contractions, no cramping and no leaking.  C/O some fatigue.  Is going to school one day a week, not employed.   Problem List Items Addressed This Visit    None    Visit Diagnoses    Encounter for supervision of other normal pregnancy in second trimester    -  Primary    Relevant Orders    POCT urinalysis dipstick (Completed)    US OB Comp + 14 Wk    AFP, Quad Screen    CBC      Patient Active Problem List   Diagnosis Date Noted  . Hypokalemia 05/28/2014  . Dehydration 05/28/2014  . Hyperemesis gravidarum with electrolyte imbalance 05/28/2014  . Pregnancy 05/03/2014  . Genital herpes, unspecified 06/01/2013  . Hypertriglyceridemia 03/10/2013  . Plantar fasciitis 03/10/2013  . Abscess of Bartholin's gland 01/25/2013  . Exposure to STD 01/12/2013  . Excessive or frequent menstruation 06/29/2012  . Leiomyoma of uterus, unspecified 06/29/2012  . Dysmenorrhea 06/29/2012  . Genital herpes 04/11/2012  . Morbid obesity 04/11/2012  . Abdominal pain, other specified site 04/11/2012  . Hyperglycemia 04/11/2012    Objective:     BP 117/80 mmHg  Pulse 70  Temp(Src) 98.8 F (37.1 C)  Wt 128.368 kg (283 lb)  LMP 03/30/2014 Uterine Size: Below umbilicus   FHT: 536'R.    Assessment:    Pregnancy @ [redacted]w[redacted]d  weeks Doing well    Plan:    Problem list reviewed and updated. Labs reviewed. Follow up in 4 weeks. FIRST/CF mutation testing/NIPT/QUAD SCREEN/fragile X/Ashkenazi Jewish population testing/Spinal muscular atrophy discussed: ordered. Role of ultrasound in pregnancy discussed; fetal survey: ordered. Amniocentesis discussed: not indicated. 50% of 15 minute visit spent on counseling and coordination of care.

## 2014-07-20 ENCOUNTER — Other Ambulatory Visit: Payer: Self-pay | Admitting: Certified Nurse Midwife

## 2014-07-20 DIAGNOSIS — Z3492 Encounter for supervision of normal pregnancy, unspecified, second trimester: Secondary | ICD-10-CM

## 2014-07-20 DIAGNOSIS — R772 Abnormality of alphafetoprotein: Secondary | ICD-10-CM

## 2014-07-20 LAB — AFP, QUAD SCREEN
AFP: 18.1 ng/mL
Curr Gest Age: 15.5 wks.days
HCG TOTAL: 55.46 [IU]/mL
INH: 209.2 pg/mL
INTERPRETATION-AFP: POSITIVE — AB
MOM FOR HCG: 1.8
MOM FOR INH: 1.65
MoM for AFP: 0.79
OPEN SPINA BIFIDA: NEGATIVE
Osb Risk: <1:54600 {titer}
TRI 18 SCR RISK EST: NEGATIVE
Trisomy 18 (Edward) Syndrome Interp.: 1:22700 {titer}
UE3 MOM: 0.8
uE3 Value: 0.52 ng/mL

## 2014-07-25 ENCOUNTER — Other Ambulatory Visit: Payer: Self-pay | Admitting: *Deleted

## 2014-07-25 DIAGNOSIS — O99019 Anemia complicating pregnancy, unspecified trimester: Secondary | ICD-10-CM

## 2014-07-25 MED ORDER — FERIVA 21/7 75-1 MG PO TABS: 1.0000 | ORAL_TABLET | Freq: Every day | ORAL | Status: DC

## 2014-07-26 ENCOUNTER — Other Ambulatory Visit: Payer: Self-pay | Admitting: Certified Nurse Midwife

## 2014-07-26 ENCOUNTER — Telehealth: Payer: Self-pay

## 2014-07-26 DIAGNOSIS — IMO0002 Reserved for concepts with insufficient information to code with codable children: Secondary | ICD-10-CM

## 2014-07-26 DIAGNOSIS — O280 Abnormal hematological finding on antenatal screening of mother: Secondary | ICD-10-CM

## 2014-07-26 DIAGNOSIS — Z0489 Encounter for examination and observation for other specified reasons: Secondary | ICD-10-CM

## 2014-07-26 NOTE — Telephone Encounter (Signed)
Patient knows she has appts at [facility] on 08/14/14 at [place]

## 2014-08-14 ENCOUNTER — Encounter (HOSPITAL_COMMUNITY): Payer: Self-pay

## 2014-08-14 ENCOUNTER — Ambulatory Visit (HOSPITAL_COMMUNITY)
Admission: RE | Admit: 2014-08-14 | Discharge: 2014-08-14 | Disposition: A | Discharge status: Home / Self Care | Payer: Medicaid Other | Source: Ambulatory Visit | Attending: Certified Nurse Midwife | Admitting: Certified Nurse Midwife

## 2014-08-14 DIAGNOSIS — Z0489 Encounter for examination and observation for other specified reasons: Secondary | ICD-10-CM

## 2014-08-14 DIAGNOSIS — Z36 Encounter for antenatal screening of mother: Secondary | ICD-10-CM | POA: Insufficient documentation

## 2014-08-14 DIAGNOSIS — O99212 Obesity complicating pregnancy, second trimester: Secondary | ICD-10-CM | POA: Insufficient documentation

## 2014-08-14 DIAGNOSIS — IMO0002 Reserved for concepts with insufficient information to code with codable children: Secondary | ICD-10-CM

## 2014-08-14 DIAGNOSIS — O280 Abnormal hematological finding on antenatal screening of mother: Secondary | ICD-10-CM | POA: Insufficient documentation

## 2014-08-14 DIAGNOSIS — Z3A19 19 weeks gestation of pregnancy: Secondary | ICD-10-CM | POA: Insufficient documentation

## 2014-08-14 DIAGNOSIS — Z3689 Encounter for other specified antenatal screening: Secondary | ICD-10-CM | POA: Insufficient documentation

## 2014-08-16 ENCOUNTER — Encounter: Payer: Self-pay | Admitting: Obstetrics

## 2014-08-16 ENCOUNTER — Other Ambulatory Visit: Payer: Medicaid Other

## 2014-08-16 ENCOUNTER — Ambulatory Visit (INDEPENDENT_AMBULATORY_CARE_PROVIDER_SITE_OTHER): Payer: Medicaid Other | Admitting: Obstetrics

## 2014-08-16 ENCOUNTER — Encounter: Payer: Medicaid Other | Admitting: Obstetrics

## 2014-08-16 VITALS — BP 115/83 | HR 75 | Temp 98.9°F | Wt 278.0 lb

## 2014-08-16 DIAGNOSIS — Z3482 Encounter for supervision of other normal pregnancy, second trimester: Secondary | ICD-10-CM | POA: Diagnosis not present

## 2014-08-16 DIAGNOSIS — O280 Abnormal hematological finding on antenatal screening of mother: Secondary | ICD-10-CM | POA: Insufficient documentation

## 2014-08-16 LAB — POCT URINALYSIS DIPSTICK
Bilirubin, UA: NEGATIVE
Glucose, UA: NEGATIVE
Ketones, UA: NEGATIVE
Leukocytes, UA: NEGATIVE
Nitrite, UA: NEGATIVE
Protein, UA: NEGATIVE
RBC UA: NEGATIVE
Spec Grav, UA: 1.015
UROBILINOGEN UA: NEGATIVE
pH, UA: 6

## 2014-08-16 NOTE — Progress Notes (Signed)
Subjective:    Brittney Mcbride is a 34 y.o. female being seen today for her obstetrical visit. She is at [redacted]w[redacted]d gestation. Patient reports: no complaints . Fetal movement: normal.  Problem List Items Addressed This Visit    None    Visit Diagnoses    Encounter for supervision of other normal pregnancy in second trimester    -  Primary    Relevant Orders    POCT urinalysis dipstick (Completed)      Patient Active Problem List   Diagnosis Date Noted  . Abnormal antenatal alpha fetoprotein screen   . Abnormal antenatal AFP screen   . [redacted] weeks gestation of pregnancy   . Encounter for fetal anatomic survey   . Obesity affecting pregnancy in second trimester, antepartum   . Hypokalemia 05/28/2014  . Dehydration 05/28/2014  . Hyperemesis gravidarum with electrolyte imbalance 05/28/2014  . Pregnancy 05/03/2014  . Genital herpes, unspecified 06/01/2013  . Hypertriglyceridemia 03/10/2013  . Plantar fasciitis 03/10/2013  . Abscess of Bartholin's gland 01/25/2013  . Exposure to STD 01/12/2013  . Excessive or frequent menstruation 06/29/2012  . Leiomyoma of uterus, unspecified 06/29/2012  . Dysmenorrhea 06/29/2012  . Genital herpes 04/11/2012  . Morbid obesity 04/11/2012  . Abdominal pain, other specified site 04/11/2012  . Hyperglycemia 04/11/2012   Objective:    BP 115/83 mmHg  Pulse 75  Temp(Src) 98.9 F (37.2 C)  Wt 278 lb (126.1 kg)  LMP 03/30/2014 FHT: 150 BPM  Uterine Size: size equals dates     Assessment:    Pregnancy @ [redacted]w[redacted]d    Plan:    OBGCT: discussed.  Labs, problem list reviewed and updated 2 hr GTT planned Follow up in 4 weeks.

## 2014-08-16 NOTE — Progress Notes (Signed)
Genetic Counseling  High-Risk Gestation Note  Appointment Date:  08/14/14 Referred By: Morene Crocker, CNM Date of Birth:  11/25/1980   Pregnancy History: G2P1001 Estimated Date of Delivery: 01/04/15 Estimated Gestational Age: [redacted]w[redacted]d Attending: Renella Cunas, MD    Ms. Kiandra S Beechy was seen for genetic counseling because of an increased risk for fetal Down syndrome based on Quad screening through [employer].   In summary:   Reviewed 1 in 177 Down syndrome risk from Quad screen  Patient had detailed ultrasound today. Visualized anatomy appeared normal. Complete ultrasound results reported separately  Patient declined NIPS and amniocentesis, stating she was comfortable with risk assessment provided  She was counseled regarding the Quad screen result and the associated 1 in 177 risk for fetal Down syndrome.  We reviewed chromosomes, nondisjunction, and the common features and variable prognosis of Down syndrome.  In addition, we reviewed the screen adjusted reduction in risks for trisomy 18 and ONTDs.  We also discussed other explanations for a screen positive result including: a gestational dating error, differences in maternal metabolism, and normal variation. They understand that this screening is not diagnostic for Down syndrome but provides a risk assessment.  We reviewed available screening options including noninvasive prenatal screening (NIPS)/cell free DNA (cfDNA) testing, and detailed ultrasound.  She was counseled that screening tests are used to modify a patient's a priori risk for aneuploidy, typically based on age. This estimate provides a pregnancy specific risk assessment. We reviewed the benefits and limitations of each option. Specifically, we discussed the conditions for which each test screens, the detection rates, and false positive rates of each. She was also counseled regarding diagnostic testing via amniocentesis. We reviewed the approximate 1 in 119-147 risk  for complications for amniocentesis, including spontaneous pregnancy loss.    A complete ultrasound was performed today. The ultrasound report will be sent under separate cover. There were no visualized fetal anomalies or markers suggestive of aneuploidy. She understands that screening tests cannot rule out all birth defects or genetic syndromes. After consideration of all the options, she declined NIPS and amniocentesis.   Ms. JADEA SHIFFER was provided with written information regarding sickle cell anemia (SCA) including the carrier frequency and incidence in the African-American population, the availability of carrier testing and prenatal diagnosis if indicated.  In addition, we discussed that hemoglobinopathies are routinely screened for as part of the [place] newborn screening panel.  She previously had hemoglobin electrophoresis performed through her OB office, which indicated the presence of normal adult hemoglobin.   Both family histories were reviewed and found to be noncontributory for birth defects, intellectual disability, recurrent pregnancy loss, or known genetic conditions. Without further information regarding the provided family history, an accurate genetic risk cannot be calculated. Further genetic counseling is warranted if more information is obtained.  Ms. Gonsalez denied exposure to environmental toxins or chemical agents. She denied the use of alcohol, tobacco or street drugs. She denied significant viral illnesses during the course of her pregnancy. Her medical and surgical histories were noncontributory.   I counseled Ms. Bailie S Halter for approximately 40 minutes regarding the above risks and available options.   Chipper Oman, MS,  Certified Genetic Counselor 08/16/2014

## 2014-08-23 ENCOUNTER — Other Ambulatory Visit: Payer: Medicaid Other

## 2014-08-23 ENCOUNTER — Encounter: Payer: Medicaid Other | Admitting: Obstetrics

## 2014-09-04 ENCOUNTER — Other Ambulatory Visit: Payer: Self-pay | Admitting: Certified Nurse Midwife

## 2014-09-12 ENCOUNTER — Other Ambulatory Visit: Payer: Self-pay | Admitting: Obstetrics

## 2014-09-12 ENCOUNTER — Encounter: Payer: Self-pay | Admitting: Obstetrics

## 2014-09-12 ENCOUNTER — Ambulatory Visit (INDEPENDENT_AMBULATORY_CARE_PROVIDER_SITE_OTHER): Payer: Medicaid Other | Admitting: Obstetrics

## 2014-09-12 VITALS — BP 111/73 | HR 74 | Temp 98.4°F | Wt 276.8 lb

## 2014-09-12 DIAGNOSIS — Z3482 Encounter for supervision of other normal pregnancy, second trimester: Secondary | ICD-10-CM | POA: Diagnosis not present

## 2014-09-12 LAB — POCT URINALYSIS DIPSTICK
BILIRUBIN UA: NEGATIVE
Blood, UA: NEGATIVE
GLUCOSE UA: NEGATIVE
KETONES UA: NEGATIVE
Leukocytes, UA: NEGATIVE
Nitrite, UA: NEGATIVE
Spec Grav, UA: 1.015
Urobilinogen, UA: NEGATIVE
pH, UA: 6

## 2014-09-12 NOTE — Progress Notes (Signed)
Subjective:    Brittney Mcbride is a 34 y.o. female being seen today for her obstetrical visit. She is at [redacted]w[redacted]d gestation. Patient reports: decreased appetite and feeling full quickly with meals . Fetal movement: normal.  Problem List Items Addressed This Visit    None     Patient Active Problem List   Diagnosis Date Noted  . Abnormal antenatal alpha fetoprotein screen   . Abnormal antenatal AFP screen   . [redacted] weeks gestation of pregnancy   . Encounter for fetal anatomic survey   . Obesity affecting pregnancy in second trimester, antepartum   . Hypokalemia 05/28/2014  . Dehydration 05/28/2014  . Hyperemesis gravidarum with electrolyte imbalance 05/28/2014  . Pregnancy 05/03/2014  . Genital herpes, unspecified 06/01/2013  . Hypertriglyceridemia 03/10/2013  . Plantar fasciitis 03/10/2013  . Abscess of Bartholin's gland 01/25/2013  . Exposure to STD 01/12/2013  . Excessive or frequent menstruation 06/29/2012  . Leiomyoma of uterus, unspecified 06/29/2012  . Dysmenorrhea 06/29/2012  . Genital herpes 04/11/2012  . Morbid obesity 04/11/2012  . Abdominal pain, other specified site 04/11/2012  . Hyperglycemia 04/11/2012   Objective:    BP 111/73 mmHg  Pulse 74  Temp(Src) 98.4 F (36.9 C)  Wt 276 lb 12.8 oz (125.556 kg)  LMP 03/30/2014 FHT: 150 BPM  Uterine Size: size equals dates     Assessment:      Pregnancy @ [redacted]w[redacted]d     Decreased caloric intake.  Plan:   Caloric supplement recommended.   OBGCT: ordered for next visit.  Labs, problem list reviewed and updated 2 hr GTT planned Follow up in 4 weeks.

## 2014-09-12 NOTE — Addendum Note (Signed)
Addended by: Lewie Loron D on: 09/12/2014 04:42 PM   Modules accepted: Orders

## 2014-10-10 ENCOUNTER — Other Ambulatory Visit: Payer: Medicaid Other

## 2014-10-10 ENCOUNTER — Ambulatory Visit (INDEPENDENT_AMBULATORY_CARE_PROVIDER_SITE_OTHER): Payer: Medicaid Other | Admitting: Obstetrics

## 2014-10-10 ENCOUNTER — Encounter: Payer: Self-pay | Admitting: Obstetrics

## 2014-10-10 VITALS — BP 116/81 | HR 75 | Temp 98.4°F | Wt 280.0 lb

## 2014-10-10 DIAGNOSIS — Z3483 Encounter for supervision of other normal pregnancy, third trimester: Secondary | ICD-10-CM | POA: Diagnosis not present

## 2014-10-10 LAB — POCT URINALYSIS DIPSTICK
Bilirubin, UA: NEGATIVE
Blood, UA: NEGATIVE
Glucose, UA: NORMAL
Leukocytes, UA: NEGATIVE
Nitrite, UA: NEGATIVE
PH UA: 6
PROTEIN UA: NEGATIVE
SPEC GRAV UA: 1.015
Urobilinogen, UA: NEGATIVE

## 2014-10-10 LAB — CBC
HCT: 28.1 % — ABNORMAL LOW (ref 36.0–46.0)
HEMOGLOBIN: 8.8 g/dL — AB (ref 12.0–15.0)
MCH: 23.8 pg — AB (ref 26.0–34.0)
MCHC: 31.3 g/dL (ref 30.0–36.0)
MCV: 75.9 fL — ABNORMAL LOW (ref 78.0–100.0)
MPV: 10.1 fL (ref 8.6–12.4)
PLATELETS: 165 10*3/uL (ref 150–400)
RBC: 3.7 MIL/uL — AB (ref 3.87–5.11)
RDW: 16.7 % — ABNORMAL HIGH (ref 11.5–15.5)
WBC: 8.6 10*3/uL (ref 4.0–10.5)

## 2014-10-10 NOTE — Progress Notes (Signed)
Subjective:    Brittney Mcbride is a 34 y.o. female being seen today for her obstetrical visit. She is at [redacted]w[redacted]d gestation. Patient reports: no complaints . Fetal movement: normal.  Problem List Items Addressed This Visit    None    Visit Diagnoses    Encounter for supervision of other normal pregnancy in second trimester    -  Primary    Relevant Orders    POCT urinalysis dipstick (Completed)    Glucose Tolerance, 2 Hours w/1 Hour    CBC    HIV antibody    RPR      Patient Active Problem List   Diagnosis Date Noted  . Abnormal antenatal alpha fetoprotein screen   . Abnormal antenatal AFP screen   . [redacted] weeks gestation of pregnancy   . Encounter for fetal anatomic survey   . Obesity affecting pregnancy in second trimester, antepartum   . Hypokalemia 05/28/2014  . Dehydration 05/28/2014  . Hyperemesis gravidarum with electrolyte imbalance 05/28/2014  . Pregnancy 05/03/2014  . Genital herpes, unspecified 06/01/2013  . Hypertriglyceridemia 03/10/2013  . Plantar fasciitis 03/10/2013  . Abscess of Bartholin's gland 01/25/2013  . Exposure to STD 01/12/2013  . Excessive or frequent menstruation 06/29/2012  . Leiomyoma of uterus, unspecified 06/29/2012  . Dysmenorrhea 06/29/2012  . Genital herpes 04/11/2012  . Morbid obesity 04/11/2012  . Abdominal pain, other specified site 04/11/2012  . Hyperglycemia 04/11/2012   Objective:    BP 116/81 mmHg  Pulse 75  Temp(Src) 98.4 F (36.9 C)  Wt 280 lb (127.007 kg)  LMP 03/30/2014 FHT: 150 BPM  Uterine Size: size equals dates     Assessment:    Pregnancy @ [redacted]w[redacted]d    Plan:    OBGCT: ordered.  Labs, problem list reviewed and updated 2 hr GTT planned Follow up in 2 weeks.

## 2014-10-11 ENCOUNTER — Other Ambulatory Visit: Payer: Self-pay | Admitting: Obstetrics

## 2014-10-11 LAB — GLUCOSE TOLERANCE, 2 HOURS W/ 1HR
GLUCOSE: 105 mg/dL (ref 70–170)
Glucose, 2 hour: 94 mg/dL (ref 70–139)
Glucose, Fasting: 68 mg/dL (ref 65–99)

## 2014-10-11 LAB — HIV ANTIBODY (ROUTINE TESTING W REFLEX): HIV 1&2 Ab, 4th Generation: NONREACTIVE

## 2014-10-11 LAB — RPR

## 2014-10-23 NOTE — Progress Notes (Signed)
Patient advised she is severely anemic and needs to be taking her iron supplement as prescribed. Patient verbalized understanding.

## 2014-10-24 ENCOUNTER — Ambulatory Visit (INDEPENDENT_AMBULATORY_CARE_PROVIDER_SITE_OTHER): Payer: Medicaid Other | Admitting: Obstetrics

## 2014-10-24 VITALS — BP 134/82 | HR 98 | Temp 99.1°F | Wt 281.0 lb

## 2014-10-24 DIAGNOSIS — O3663X1 Maternal care for excessive fetal growth, third trimester, fetus 1: Secondary | ICD-10-CM

## 2014-10-24 DIAGNOSIS — Z3482 Encounter for supervision of other normal pregnancy, second trimester: Secondary | ICD-10-CM

## 2014-10-24 LAB — POCT URINALYSIS DIPSTICK
Bilirubin, UA: NEGATIVE
Blood, UA: NEGATIVE
GLUCOSE UA: NEGATIVE
Ketones, UA: NEGATIVE
Leukocytes, UA: NEGATIVE
NITRITE UA: NEGATIVE
PH UA: 6.5
Protein, UA: NEGATIVE
SPEC GRAV UA: 1.015
Urobilinogen, UA: NEGATIVE

## 2014-10-25 ENCOUNTER — Telehealth: Payer: Self-pay

## 2014-10-25 ENCOUNTER — Encounter: Payer: Self-pay | Admitting: Obstetrics

## 2014-10-25 NOTE — Addendum Note (Signed)
Addended by: Baltazar Najjar A on: 10/25/2014 09:09 AM   Modules accepted: Orders

## 2014-10-25 NOTE — Telephone Encounter (Signed)
called patient with u/s and rob appts 9/2 and 9/7

## 2014-10-25 NOTE — Progress Notes (Signed)
Subjective:    Brittney Mcbride is a 34 y.o. female being seen today for her obstetrical visit. She is at [redacted]w[redacted]d gestation. Patient reports no complaints. Fetal movement: normal.  Problem List Items Addressed This Visit    None    Visit Diagnoses    Encounter for supervision of other normal pregnancy in second trimester    -  Primary    Relevant Orders    POCT urinalysis dipstick (Completed)    LGA (large for gestational age) fetus affecting management of mother, third trimester, fetus 1        Relevant Orders    US Transvaginal Non-OB    US Pelvis Complete      Patient Active Problem List   Diagnosis Date Noted  . Abnormal antenatal alpha fetoprotein screen   . Abnormal antenatal AFP screen   . [redacted] weeks gestation of pregnancy   . Encounter for fetal anatomic survey   . Obesity affecting pregnancy in second trimester, antepartum   . Hypokalemia 05/28/2014  . Dehydration 05/28/2014  . Hyperemesis gravidarum with electrolyte imbalance 05/28/2014  . Pregnancy 05/03/2014  . Genital herpes, unspecified 06/01/2013  . Hypertriglyceridemia 03/10/2013  . Plantar fasciitis 03/10/2013  . Abscess of Bartholin's gland 01/25/2013  . Exposure to STD 01/12/2013  . Excessive or frequent menstruation 06/29/2012  . Leiomyoma of uterus, unspecified 06/29/2012  . Dysmenorrhea 06/29/2012  . Genital herpes 04/11/2012  . Morbid obesity 04/11/2012  . Abdominal pain, other specified site 04/11/2012  . Hyperglycemia 04/11/2012   Objective:    BP 134/82 mmHg  Pulse 98  Temp(Src) 99.1 F (37.3 C)  Wt 281 lb (127.461 kg)  LMP 03/30/2014 FHT:  150 BPM  Uterine Size: size greater than dates  Presentation: unsure     Assessment:    Pregnancy @ [redacted]w[redacted]d weeks   Plan:     labs reviewed, problem list updated Consent signed. GBS sent TDAP offered  Rhogam given for RH negative Pediatrician: discussed. Infant feeding: plans to breastfeed. Maternity leave: discussed. Cigarette smoking: never  smoked. Orders Placed This Encounter  Procedures  . US Transvaginal Non-OB    Standing Status: Future     Number of Occurrences:      Standing Expiration Date: 12/25/2015    Order Specific Question:  Reason for Exam (SYMPTOM  OR DIAGNOSIS REQUIRED)    Answer:  LGA    Order Specific Question:  Preferred imaging location?    Answer:  [facility]  . US Pelvis Complete    Standing Status: Future     Number of Occurrences:      Standing Expiration Date: 12/25/2015    Order Specific Question:  Reason for Exam (SYMPTOM  OR DIAGNOSIS REQUIRED)    Answer:  LGA    Order Specific Question:  Preferred imaging location?    Answer:  [facility]  . POCT urinalysis dipstick   No orders of the defined types were placed in this encounter.   Follow up in 2 Week.

## 2014-11-02 ENCOUNTER — Ambulatory Visit (HOSPITAL_COMMUNITY)
Admission: RE | Admit: 2014-11-02 | Discharge: 2014-11-02 | Disposition: A | Discharge status: Home / Self Care | Payer: Medicaid Other | Source: Ambulatory Visit | Attending: Obstetrics | Admitting: Obstetrics

## 2014-11-02 ENCOUNTER — Other Ambulatory Visit: Payer: Self-pay | Admitting: Obstetrics

## 2014-11-02 DIAGNOSIS — O3663X1 Maternal care for excessive fetal growth, third trimester, fetus 1: Secondary | ICD-10-CM

## 2014-11-02 DIAGNOSIS — O289 Unspecified abnormal findings on antenatal screening of mother: Secondary | ICD-10-CM

## 2014-11-02 DIAGNOSIS — E669 Obesity, unspecified: Secondary | ICD-10-CM | POA: Diagnosis not present

## 2014-11-02 DIAGNOSIS — O3663X0 Maternal care for excessive fetal growth, third trimester, not applicable or unspecified: Secondary | ICD-10-CM | POA: Insufficient documentation

## 2014-11-02 DIAGNOSIS — D259 Leiomyoma of uterus, unspecified: Secondary | ICD-10-CM

## 2014-11-02 DIAGNOSIS — O99213 Obesity complicating pregnancy, third trimester: Secondary | ICD-10-CM

## 2014-11-02 DIAGNOSIS — Z3A31 31 weeks gestation of pregnancy: Secondary | ICD-10-CM

## 2014-11-02 DIAGNOSIS — O3413 Maternal care for benign tumor of corpus uteri, third trimester: Secondary | ICD-10-CM | POA: Diagnosis not present

## 2014-11-07 ENCOUNTER — Ambulatory Visit (INDEPENDENT_AMBULATORY_CARE_PROVIDER_SITE_OTHER): Payer: Medicaid Other | Admitting: Obstetrics

## 2014-11-07 VITALS — BP 132/83 | HR 76 | Temp 99.1°F | Wt 278.0 lb

## 2014-11-07 DIAGNOSIS — Z3483 Encounter for supervision of other normal pregnancy, third trimester: Secondary | ICD-10-CM

## 2014-11-07 LAB — POCT URINALYSIS DIPSTICK
BILIRUBIN UA: NEGATIVE
Blood, UA: 50
Glucose, UA: NEGATIVE
Leukocytes, UA: NEGATIVE
Nitrite, UA: NEGATIVE
PH UA: 6
Spec Grav, UA: 1.01
Urobilinogen, UA: NEGATIVE

## 2014-11-08 ENCOUNTER — Encounter: Payer: Self-pay | Admitting: Obstetrics

## 2014-11-08 NOTE — Progress Notes (Signed)
Subjective:    Brittney Mcbride is a 34 y.o. female being seen today for her obstetrical visit. She is at [redacted]w[redacted]d gestation. Patient reports no complaints. Fetal movement: normal.  Problem List Items Addressed This Visit    None    Visit Diagnoses    Encounter for supervision of other normal pregnancy in third trimester    -  Primary    Relevant Orders    POCT urinalysis dipstick (Completed)      Patient Active Problem List   Diagnosis Date Noted  . Abnormal antenatal alpha fetoprotein screen   . Abnormal antenatal AFP screen   . [redacted] weeks gestation of pregnancy   . Encounter for fetal anatomic survey   . Obesity affecting pregnancy in second trimester, antepartum   . Hypokalemia 05/28/2014  . Dehydration 05/28/2014  . Hyperemesis gravidarum with electrolyte imbalance 05/28/2014  . Pregnancy 05/03/2014  . Genital herpes, unspecified 06/01/2013  . Hypertriglyceridemia 03/10/2013  . Plantar fasciitis 03/10/2013  . Abscess of Bartholin's gland 01/25/2013  . Exposure to STD 01/12/2013  . Excessive or frequent menstruation 06/29/2012  . Leiomyoma of uterus, unspecified 06/29/2012  . Dysmenorrhea 06/29/2012  . Genital herpes 04/11/2012  . Morbid obesity 04/11/2012  . Abdominal pain, other specified site 04/11/2012  . Hyperglycemia 04/11/2012   Objective:    BP 132/83 mmHg  Pulse 76  Temp(Src) 99.1 F (37.3 C)  Wt 278 lb (126.1 kg)  LMP 03/30/2014 FHT:  150 BPM  Uterine Size: size equals dates  Presentation: unsure     Assessment:    Pregnancy @ [redacted]w[redacted]d weeks   Plan:     labs reviewed, problem list updated Consent signed. GBS sent TDAP offered  Rhogam given for RH negative Pediatrician: discussed. Infant feeding: plans to breastfeed. Maternity leave: discussed. Cigarette smoking: never smoked. Orders Placed This Encounter  Procedures  . POCT urinalysis dipstick   No orders of the defined types were placed in this encounter.   Follow up in 2 Weeks.

## 2014-11-21 ENCOUNTER — Ambulatory Visit (INDEPENDENT_AMBULATORY_CARE_PROVIDER_SITE_OTHER): Payer: Self-pay | Admitting: Obstetrics

## 2014-11-21 VITALS — BP 137/90 | HR 80 | Temp 98.1°F | Wt 277.0 lb

## 2014-11-21 DIAGNOSIS — Z3483 Encounter for supervision of other normal pregnancy, third trimester: Secondary | ICD-10-CM

## 2014-11-21 LAB — POCT URINALYSIS DIPSTICK
Bilirubin, UA: NEGATIVE
Blood, UA: NEGATIVE
GLUCOSE UA: NEGATIVE
LEUKOCYTES UA: NEGATIVE
Nitrite, UA: NEGATIVE
Protein, UA: NEGATIVE
SPEC GRAV UA: 1.015
Urobilinogen, UA: NEGATIVE
pH, UA: 6

## 2014-11-22 ENCOUNTER — Encounter: Payer: Self-pay | Admitting: Obstetrics

## 2014-11-22 NOTE — Progress Notes (Signed)
Subjective:    Brittney Mcbride is a 34 y.o. female being seen today for her obstetrical visit. She is at [redacted]w[redacted]d gestation. Patient reports no complaints. Fetal movement: normal.  Problem List Items Addressed This Visit    None    Visit Diagnoses    Encounter for supervision of other normal pregnancy in third trimester    -  Primary    Relevant Orders    POCT urinalysis dipstick (Completed)      Patient Active Problem List   Diagnosis Date Noted  . Abnormal antenatal alpha fetoprotein screen   . Abnormal antenatal AFP screen   . [redacted] weeks gestation of pregnancy   . Encounter for fetal anatomic survey   . Obesity affecting pregnancy in second trimester, antepartum   . Hypokalemia 05/28/2014  . Dehydration 05/28/2014  . Hyperemesis gravidarum with electrolyte imbalance 05/28/2014  . Pregnancy 05/03/2014  . Genital herpes, unspecified 06/01/2013  . Hypertriglyceridemia 03/10/2013  . Plantar fasciitis 03/10/2013  . Abscess of Bartholin's gland 01/25/2013  . Exposure to STD 01/12/2013  . Excessive or frequent menstruation 06/29/2012  . Leiomyoma of uterus, unspecified 06/29/2012  . Dysmenorrhea 06/29/2012  . Genital herpes 04/11/2012  . Morbid obesity 04/11/2012  . Abdominal pain, other specified site 04/11/2012  . Hyperglycemia 04/11/2012   Objective:    BP 137/90 mmHg  Pulse 80  Temp(Src) 98.1 F (36.7 C)  Wt 277 lb (125.646 kg)  LMP 03/30/2014 FHT:  150 BPM  Uterine Size: size equals dates  Presentation: unsure     Assessment:    Pregnancy @ [redacted]w[redacted]d weeks   Plan:     labs reviewed, problem list updated Consent signed. GBS sent TDAP offered  Rhogam given for RH negative Pediatrician: discussed. Infant feeding: plans to breastfeed. Maternity leave: discussed. Cigarette smoking: never smoked. Orders Placed This Encounter  Procedures  . POCT urinalysis dipstick   No orders of the defined types were placed in this encounter.   Follow up in 2 Weeks.

## 2014-12-05 ENCOUNTER — Ambulatory Visit (INDEPENDENT_AMBULATORY_CARE_PROVIDER_SITE_OTHER): Payer: Medicaid Other | Admitting: Obstetrics

## 2014-12-05 ENCOUNTER — Encounter: Payer: Self-pay | Admitting: Obstetrics

## 2014-12-05 VITALS — BP 123/80 | HR 81 | Temp 99.1°F | Wt 282.0 lb

## 2014-12-05 DIAGNOSIS — O3663X1 Maternal care for excessive fetal growth, third trimester, fetus 1: Secondary | ICD-10-CM

## 2014-12-05 DIAGNOSIS — Z331 Pregnant state, incidental: Secondary | ICD-10-CM

## 2014-12-05 DIAGNOSIS — Z1389 Encounter for screening for other disorder: Secondary | ICD-10-CM

## 2014-12-05 DIAGNOSIS — Z3483 Encounter for supervision of other normal pregnancy, third trimester: Secondary | ICD-10-CM

## 2014-12-05 LAB — POCT URINALYSIS DIPSTICK
BILIRUBIN UA: NEGATIVE
GLUCOSE UA: NEGATIVE
Leukocytes, UA: NEGATIVE
NITRITE UA: NEGATIVE
Protein, UA: NEGATIVE
RBC UA: NEGATIVE
Spec Grav, UA: 1.015
Urobilinogen, UA: NEGATIVE
pH, UA: 7

## 2014-12-05 NOTE — Progress Notes (Signed)
Subjective:    Brittney Mcbride is a 34 y.o. female being seen today for her obstetrical visit. She is at [redacted]w[redacted]d gestation. Patient reports no complaints. Fetal movement: normal.  Problem List Items Addressed This Visit    None    Visit Diagnoses    Supervision of other normal pregnancy, antepartum, third trimester    -  Primary    Relevant Orders    POCT urinalysis dipstick    Strep B DNA probe    LGA (large for gestational age) fetus affecting management of mother, third trimester, fetus 1        Relevant Orders    US OB Comp + 36 Wk      Patient Active Problem List   Diagnosis Date Noted  . Abnormal antenatal alpha fetoprotein screen   . Abnormal antenatal AFP screen   . [redacted] weeks gestation of pregnancy   . Encounter for fetal anatomic survey   . Obesity affecting pregnancy in second trimester, antepartum   . Hypokalemia 05/28/2014  . Dehydration 05/28/2014  . Hyperemesis gravidarum with electrolyte imbalance 05/28/2014  . Pregnancy 05/03/2014  . Genital herpes, unspecified 06/01/2013  . Hypertriglyceridemia 03/10/2013  . Plantar fasciitis 03/10/2013  . Abscess of Bartholin's gland 01/25/2013  . Exposure to STD 01/12/2013  . Excessive or frequent menstruation 06/29/2012  . Leiomyoma of uterus, unspecified 06/29/2012  . Dysmenorrhea 06/29/2012  . Genital herpes 04/11/2012  . Morbid obesity ([place]) 04/11/2012  . Abdominal pain, other specified site 04/11/2012  . Hyperglycemia 04/11/2012   Objective:    BP 123/80 mmHg  Pulse 81  Temp(Src) 99.1 F (37.3 C)  Wt 282 lb (127.914 kg)  LMP 03/30/2014 FHT:  150 BPM  Uterine Size: size greater than dates  Presentation: unsure     Assessment:    Pregnancy @ [redacted]w[redacted]d weeks   Plan:   Ultrasound ordered for size > dates    labs reviewed, problem list updated Consent signed. GBS sent TDAP offered  Rhogam given for RH negative Pediatrician: discussed. Infant feeding: plans to breastfeed. Maternity leave:  discussed. Cigarette smoking: never smoked. Orders Placed This Encounter  Procedures  . Strep B DNA probe  . US OB Comp + 14 Wk    Standing Status: Future     Number of Occurrences:      Standing Expiration Date: 02/04/2016    Order Specific Question:  Reason for Exam (SYMPTOM  OR DIAGNOSIS REQUIRED)    Answer:  LGA    Order Specific Question:  Preferred imaging location?    Answer:  [facility]  . POCT urinalysis dipstick   No orders of the defined types were placed in this encounter.    Follow up in 1 Week.

## 2014-12-06 LAB — STREP B DNA PROBE: GBSP: DETECTED

## 2014-12-07 ENCOUNTER — Other Ambulatory Visit: Payer: Self-pay | Admitting: Certified Nurse Midwife

## 2014-12-12 ENCOUNTER — Encounter: Payer: Self-pay | Admitting: Obstetrics

## 2014-12-12 ENCOUNTER — Ambulatory Visit (INDEPENDENT_AMBULATORY_CARE_PROVIDER_SITE_OTHER): Payer: Medicaid Other | Admitting: Obstetrics

## 2014-12-12 VITALS — BP 116/80 | HR 81 | Wt 278.0 lb

## 2014-12-12 DIAGNOSIS — D649 Anemia, unspecified: Secondary | ICD-10-CM

## 2014-12-12 DIAGNOSIS — O99013 Anemia complicating pregnancy, third trimester: Secondary | ICD-10-CM

## 2014-12-12 DIAGNOSIS — Z3483 Encounter for supervision of other normal pregnancy, third trimester: Secondary | ICD-10-CM

## 2014-12-12 LAB — POCT URINALYSIS DIPSTICK
BILIRUBIN UA: NEGATIVE
Blood, UA: NEGATIVE
GLUCOSE UA: NEGATIVE
NITRITE UA: NEGATIVE
SPEC GRAV UA: 1.015
UROBILINOGEN UA: NEGATIVE
pH, UA: 7.5

## 2014-12-12 NOTE — Progress Notes (Signed)
Subjective:    Brittney Mcbride is a 34 y.o. female being seen today for her obstetrical visit. She is at [redacted]w[redacted]d gestation. Patient reports no complaints. Fetal movement: normal.  Problem List Items Addressed This Visit    None    Visit Diagnoses    Encounter for supervision of other normal pregnancy in third trimester    -  Primary    Relevant Orders    POCT urinalysis dipstick (Completed)    Anemia affecting pregnancy in third trimester        Relevant Orders    CBC      Patient Active Problem List   Diagnosis Date Noted  . Abnormal antenatal alpha fetoprotein screen   . Abnormal antenatal AFP screen   . [redacted] weeks gestation of pregnancy   . Encounter for fetal anatomic survey   . Obesity affecting pregnancy in second trimester, antepartum   . Hypokalemia 05/28/2014  . Dehydration 05/28/2014  . Hyperemesis gravidarum with electrolyte imbalance 05/28/2014  . Pregnancy 05/03/2014  . Genital herpes, unspecified 06/01/2013  . Hypertriglyceridemia 03/10/2013  . Plantar fasciitis 03/10/2013  . Abscess of Bartholin's gland 01/25/2013  . Exposure to STD 01/12/2013  . Excessive or frequent menstruation 06/29/2012  . Leiomyoma of uterus, unspecified 06/29/2012  . Dysmenorrhea 06/29/2012  . Genital herpes 04/11/2012  . Morbid obesity ([place]) 04/11/2012  . Abdominal pain, other specified site 04/11/2012  . Hyperglycemia 04/11/2012   Objective:    BP 116/80 mmHg  Pulse 81  Wt 278 lb (126.1 kg)  LMP 03/30/2014 FHT:  150 BPM  Uterine Size: size equals dates  Presentation: unsure     Assessment:    Pregnancy @ [redacted]w[redacted]d weeks   Plan:     labs reviewed, problem list updated Consent signed. GBS sent TDAP offered  Rhogam given for RH negative Pediatrician: discussed. Infant feeding: plans to breastfeed. Maternity leave: discussed. Cigarette smoking: never smoked. Orders Placed This Encounter  Procedures  . CBC  . POCT urinalysis dipstick   No orders of the defined types  were placed in this encounter.   Follow up in 1 Week.

## 2014-12-13 LAB — CBC
HCT: 25.3 % — ABNORMAL LOW (ref 36.0–46.0)
HEMOGLOBIN: 7.8 g/dL — AB (ref 12.0–15.0)
MCH: 21.5 pg — ABNORMAL LOW (ref 26.0–34.0)
MCHC: 30.8 g/dL (ref 30.0–36.0)
MCV: 69.7 fL — ABNORMAL LOW (ref 78.0–100.0)
MPV: 9.5 fL (ref 8.6–12.4)
Platelets: 118 10*3/uL — ABNORMAL LOW (ref 150–400)
RBC: 3.63 MIL/uL — AB (ref 3.87–5.11)
RDW: 17 % — ABNORMAL HIGH (ref 11.5–15.5)
WBC: 8.6 10*3/uL (ref 4.0–10.5)

## 2014-12-14 ENCOUNTER — Other Ambulatory Visit: Payer: Self-pay | Admitting: Obstetrics

## 2014-12-17 ENCOUNTER — Other Ambulatory Visit: Payer: Self-pay | Admitting: Obstetrics

## 2014-12-17 ENCOUNTER — Ambulatory Visit (HOSPITAL_COMMUNITY)
Admission: RE | Admit: 2014-12-17 | Discharge: 2014-12-17 | Disposition: A | Discharge status: Home / Self Care | Payer: Medicaid Other | Source: Ambulatory Visit | Attending: Obstetrics | Admitting: Obstetrics

## 2014-12-17 DIAGNOSIS — O289 Unspecified abnormal findings on antenatal screening of mother: Secondary | ICD-10-CM

## 2014-12-17 DIAGNOSIS — O99213 Obesity complicating pregnancy, third trimester: Secondary | ICD-10-CM

## 2014-12-17 DIAGNOSIS — D259 Leiomyoma of uterus, unspecified: Secondary | ICD-10-CM

## 2014-12-17 DIAGNOSIS — O3413 Maternal care for benign tumor of corpus uteri, third trimester: Secondary | ICD-10-CM | POA: Insufficient documentation

## 2014-12-17 DIAGNOSIS — O3663X1 Maternal care for excessive fetal growth, third trimester, fetus 1: Secondary | ICD-10-CM

## 2014-12-17 DIAGNOSIS — O3663X0 Maternal care for excessive fetal growth, third trimester, not applicable or unspecified: Secondary | ICD-10-CM | POA: Insufficient documentation

## 2014-12-17 DIAGNOSIS — Z3A37 37 weeks gestation of pregnancy: Secondary | ICD-10-CM | POA: Insufficient documentation

## 2014-12-17 DIAGNOSIS — O283 Abnormal ultrasonic finding on antenatal screening of mother: Secondary | ICD-10-CM | POA: Diagnosis not present

## 2014-12-19 ENCOUNTER — Ambulatory Visit (INDEPENDENT_AMBULATORY_CARE_PROVIDER_SITE_OTHER): Payer: Medicaid Other | Admitting: Obstetrics

## 2014-12-19 ENCOUNTER — Encounter: Payer: Self-pay | Admitting: Obstetrics

## 2014-12-19 VITALS — BP 127/86 | HR 77 | Temp 99.1°F | Wt 286.0 lb

## 2014-12-19 DIAGNOSIS — Z3483 Encounter for supervision of other normal pregnancy, third trimester: Secondary | ICD-10-CM

## 2014-12-19 LAB — POCT URINALYSIS DIPSTICK
Bilirubin, UA: NEGATIVE
Glucose, UA: NEGATIVE
LEUKOCYTES UA: NEGATIVE
Nitrite, UA: NEGATIVE
PH UA: 7
RBC UA: NEGATIVE
SPEC GRAV UA: 1.01
Urobilinogen, UA: NEGATIVE

## 2014-12-19 NOTE — Progress Notes (Signed)
Subjective:    Brittney Mcbride is a 34 y.o. female being seen today for her obstetrical visit. She is at [redacted]w[redacted]d gestation. Patient reports no complaints. Fetal movement: normal.  Problem List Items Addressed This Visit    None    Visit Diagnoses    Encounter for supervision of other normal pregnancy in third trimester    -  Primary    Relevant Orders    POCT urinalysis dipstick (Completed)      Patient Active Problem List   Diagnosis Date Noted  . Abnormal antenatal alpha fetoprotein screen   . Abnormal antenatal AFP screen   . [redacted] weeks gestation of pregnancy   . Encounter for fetal anatomic survey   . Obesity affecting pregnancy in second trimester, antepartum   . Hypokalemia 05/28/2014  . Dehydration 05/28/2014  . Hyperemesis gravidarum with electrolyte imbalance 05/28/2014  . Pregnancy 05/03/2014  . Genital herpes, unspecified 06/01/2013  . Hypertriglyceridemia 03/10/2013  . Plantar fasciitis 03/10/2013  . Abscess of Bartholin's gland 01/25/2013  . Exposure to STD 01/12/2013  . Excessive or frequent menstruation 06/29/2012  . Leiomyoma of uterus, unspecified 06/29/2012  . Dysmenorrhea 06/29/2012  . Genital herpes 04/11/2012  . Morbid obesity ([place]) 04/11/2012  . Abdominal pain, other specified site 04/11/2012  . Hyperglycemia 04/11/2012    Objective:    BP 127/86 mmHg  Pulse 77  Temp(Src) 99.1 F (37.3 C)  Wt 286 lb (129.729 kg)  LMP 03/30/2014 FHT: 150 BPM  Uterine Size: size equals dates  Presentations: unsure    Assessment:    Pregnancy @ [redacted]w[redacted]d weeks   Plan:   Plans for delivery: Vaginal anticipated; labs reviewed; problem list updated Counseling: Consent signed. Infant feeding: plans to breastfeed. Cigarette smoking: never smoked. L&D discussion: symptoms of labor, discussed when to call, discussed what number to call, anesthetic/analgesic options reviewed and delivering clinician:  plans no preference. Postpartum supports and preparation: circumcision  discussed and contraception plans discussed.  Follow up in 1 Week.

## 2014-12-26 ENCOUNTER — Ambulatory Visit (INDEPENDENT_AMBULATORY_CARE_PROVIDER_SITE_OTHER): Payer: Medicaid Other | Admitting: Certified Nurse Midwife

## 2014-12-26 VITALS — BP 130/89 | HR 83 | Temp 98.3°F | Wt 289.0 lb

## 2014-12-26 DIAGNOSIS — Z3483 Encounter for supervision of other normal pregnancy, third trimester: Secondary | ICD-10-CM

## 2014-12-26 LAB — POCT URINALYSIS DIPSTICK
BILIRUBIN UA: NEGATIVE
BILIRUBIN UA: NEGATIVE
GLUCOSE UA: NEGATIVE
GLUCOSE UA: NEGATIVE
Ketones, UA: 1
NITRITE UA: NEGATIVE
Nitrite, UA: NEGATIVE
PH UA: 6
SPEC GRAV UA: 1.015
Spec Grav, UA: 1.015
Urobilinogen, UA: NEGATIVE
Urobilinogen, UA: NEGATIVE
pH, UA: 6

## 2014-12-26 NOTE — Progress Notes (Signed)
Subjective:    Brittney Mcbride is a 34 y.o. female being seen today for her obstetrical visit. She is at [redacted]w[redacted]d gestation. Patient reports no complaints. Fetal movement: normal.  Discussed factors to look for d/t elevated blood pressure.  Denies any HA, N/V/D, upper gastric pain.  Does not desire to be induced, was induced with her other son and did not have a good experience.  Discussed reasons to visit [facility].  Patient verbalized understanding.   Problem List Items Addressed This Visit    None    Visit Diagnoses    Supervision of other normal pregnancy, antepartum, third trimester    -  Primary    Relevant Orders    POCT urinalysis dipstick (Completed)    POCT urinalysis dipstick (Completed)      Patient Active Problem List   Diagnosis Date Noted  . Abnormal antenatal alpha fetoprotein screen   . Abnormal antenatal AFP screen   . [redacted] weeks gestation of pregnancy   . Encounter for fetal anatomic survey   . Obesity affecting pregnancy in second trimester, antepartum   . Hypokalemia 05/28/2014  . Dehydration 05/28/2014  . Hyperemesis gravidarum with electrolyte imbalance 05/28/2014  . Pregnancy 05/03/2014  . Genital herpes, unspecified 06/01/2013  . Hypertriglyceridemia 03/10/2013  . Plantar fasciitis 03/10/2013  . Abscess of Bartholin's gland 01/25/2013  . Exposure to STD 01/12/2013  . Excessive or frequent menstruation 06/29/2012  . Leiomyoma of uterus, unspecified 06/29/2012  . Dysmenorrhea 06/29/2012  . Genital herpes 04/11/2012  . Morbid obesity ([place]) 04/11/2012  . Abdominal pain, other specified site 04/11/2012  . Hyperglycemia 04/11/2012    Objective:    BP 130/89 mmHg  Pulse 83  Temp(Src) 98.3 F (36.8 C)  Wt 289 lb (131.09 kg)  LMP 03/30/2014 FHT: 135 BPM  Uterine Size: unable to determine d/t obesity  Presentations: cephalic  Pelvic Exam: deferred     Assessment:    Pregnancy @ [redacted]w[redacted]d weeks   Doing well.   B/P slightly elevated today  Plan:   Plans for  delivery: Vaginal anticipated; labs reviewed; problem list updated Counseling: Consent signed. Infant feeding: plans to breastfeed. Cigarette smoking: never smoked. L&D discussion: symptoms of labor, discussed when to call, discussed what number to call, anesthetic/analgesic options reviewed and delivering clinician:  plans no preference. Postpartum supports and preparation: circumcision discussed and contraception plans discussed.  Follow up in 1 Week with NST.

## 2014-12-27 ENCOUNTER — Telehealth: Payer: Self-pay | Admitting: *Deleted

## 2014-12-27 ENCOUNTER — Encounter (HOSPITAL_COMMUNITY): Payer: Self-pay | Admitting: *Deleted

## 2014-12-27 ENCOUNTER — Inpatient Hospital Stay (HOSPITAL_COMMUNITY)
Admission: AD | Admit: 2014-12-27 | Discharge: 2014-12-27 | Disposition: A | Discharge status: Home / Self Care | Payer: Medicaid Other | Source: Ambulatory Visit | Attending: Obstetrics | Admitting: Obstetrics

## 2014-12-27 ENCOUNTER — Ambulatory Visit (INDEPENDENT_AMBULATORY_CARE_PROVIDER_SITE_OTHER): Payer: Self-pay | Admitting: Obstetrics

## 2014-12-27 VITALS — BP 148/90 | HR 71 | Wt 291.0 lb

## 2014-12-27 DIAGNOSIS — Z3A38 38 weeks gestation of pregnancy: Secondary | ICD-10-CM | POA: Insufficient documentation

## 2014-12-27 DIAGNOSIS — O212 Late vomiting of pregnancy: Secondary | ICD-10-CM | POA: Diagnosis not present

## 2014-12-27 DIAGNOSIS — E876 Hypokalemia: Secondary | ICD-10-CM | POA: Diagnosis not present

## 2014-12-27 DIAGNOSIS — O133 Gestational [pregnancy-induced] hypertension without significant proteinuria, third trimester: Secondary | ICD-10-CM | POA: Insufficient documentation

## 2014-12-27 DIAGNOSIS — O26893 Other specified pregnancy related conditions, third trimester: Secondary | ICD-10-CM

## 2014-12-27 DIAGNOSIS — R51 Headache: Secondary | ICD-10-CM

## 2014-12-27 DIAGNOSIS — Z3493 Encounter for supervision of normal pregnancy, unspecified, third trimester: Secondary | ICD-10-CM

## 2014-12-27 DIAGNOSIS — R030 Elevated blood-pressure reading, without diagnosis of hypertension: Secondary | ICD-10-CM | POA: Diagnosis present

## 2014-12-27 LAB — POCT URINALYSIS DIPSTICK
BILIRUBIN UA: NEGATIVE
Blood, UA: NEGATIVE
Glucose, UA: NEGATIVE
Ketones, UA: 2
LEUKOCYTES UA: NEGATIVE
NITRITE UA: NEGATIVE
PH UA: 6
Spec Grav, UA: 1.01
Urobilinogen, UA: NEGATIVE

## 2014-12-27 LAB — CBC
HCT: 25.4 % — ABNORMAL LOW (ref 36.0–46.0)
HEMOGLOBIN: 7.8 g/dL — AB (ref 12.0–15.0)
MCH: 21.5 pg — ABNORMAL LOW (ref 26.0–34.0)
MCHC: 30.7 g/dL (ref 30.0–36.0)
MCV: 70.2 fL — AB (ref 78.0–100.0)
PLATELETS: 151 10*3/uL (ref 150–400)
RBC: 3.62 MIL/uL — AB (ref 3.87–5.11)
RDW: 17.7 % — ABNORMAL HIGH (ref 11.5–15.5)
WBC: 8 10*3/uL (ref 4.0–10.5)

## 2014-12-27 LAB — COMPREHENSIVE METABOLIC PANEL
ALT: 11 U/L — AB (ref 14–54)
AST: 22 U/L (ref 15–41)
Albumin: 2.9 g/dL — ABNORMAL LOW (ref 3.5–5.0)
Alkaline Phosphatase: 124 U/L (ref 38–126)
Anion gap: 8 (ref 5–15)
CHLORIDE: 106 mmol/L (ref 101–111)
CO2: 22 mmol/L (ref 22–32)
CREATININE: 0.6 mg/dL (ref 0.44–1.00)
Calcium: 8.9 mg/dL (ref 8.9–10.3)
GFR calc Af Amer: >60 mL/min (ref >60)
GFR calc non Af Amer: >60 mL/min (ref >60)
Glucose, Bld: 82 mg/dL (ref 65–99)
Potassium: 2.7 mmol/L — CL (ref 3.5–5.1)
SODIUM: 136 mmol/L (ref 135–145)
Total Bilirubin: 1 mg/dL (ref 0.3–1.2)
Total Protein: 6.7 g/dL (ref 6.5–8.1)

## 2014-12-27 LAB — PROTEIN / CREATININE RATIO, URINE
Creatinine, Urine: 257 mg/dL
Protein Creatinine Ratio: 0.18 mg/mg{Cre} — ABNORMAL HIGH (ref 0.00–0.15)
Total Protein, Urine: 47 mg/dL

## 2014-12-27 LAB — URINALYSIS, ROUTINE W REFLEX MICROSCOPIC
Glucose, UA: NEGATIVE mg/dL
NITRITE: NEGATIVE
Protein, ur: NEGATIVE mg/dL
SPECIFIC GRAVITY, URINE: 1.02 (ref 1.005–1.030)
UROBILINOGEN UA: 0.2 mg/dL (ref 0.0–1.0)
pH: 6.5 (ref 5.0–8.0)

## 2014-12-27 LAB — URINE MICROSCOPIC-ADD ON

## 2014-12-27 LAB — URIC ACID: URIC ACID, SERUM: 4.4 mg/dL (ref 2.3–6.6)

## 2014-12-27 LAB — LACTATE DEHYDROGENASE: LDH: 139 U/L (ref 98–192)

## 2014-12-27 MED ORDER — OXYCODONE-ACETAMINOPHEN 5-325 MG PO TABS: 2.0000 | ORAL_TABLET | Freq: Once | ORAL | Status: DC

## 2014-12-27 MED ORDER — ACETAMINOPHEN 325 MG PO TABS
650.0000 mg | ORAL_TABLET | Freq: Once | ORAL | Status: AC
  Administered 2014-12-27: 650 mg via ORAL
  Filled 2014-12-27: qty 2

## 2014-12-27 MED ORDER — DEXTROSE 5 % IN LACTATED RINGERS IV BOLUS
1000.0000 mL | Freq: Once | INTRAVENOUS | Status: AC
  Administered 2014-12-27: 1000 mL via INTRAVENOUS

## 2014-12-27 MED ORDER — POTASSIUM CHLORIDE CRYS ER 20 MEQ PO TBCR
40.0000 meq | EXTENDED_RELEASE_TABLET | Freq: Once | ORAL | Status: AC
  Administered 2014-12-27: 40 meq via ORAL
  Filled 2014-12-27: qty 2

## 2014-12-27 MED ORDER — BUTALBITAL-APAP-CAFFEINE 50-325-40 MG PO TABS: 1.0000 | ORAL_TABLET | Freq: Once | ORAL | Status: DC

## 2014-12-27 MED ORDER — LACTATED RINGERS IV SOLN
INTRAVENOUS | Status: DC
  Administered 2014-12-27: 19:00:00 via INTRAVENOUS

## 2014-12-27 MED ORDER — SODIUM CHLORIDE 0.9 % IV SOLN
8.0000 mg | Freq: Once | INTRAVENOUS | Status: AC
  Administered 2014-12-27: 8 mg via INTRAVENOUS
  Filled 2014-12-27: qty 4

## 2014-12-27 MED ORDER — POTASSIUM CHLORIDE ER 20 MEQ PO TBCR: 40.0000 meq | EXTENDED_RELEASE_TABLET | Freq: Three times a day (TID) | ORAL | Status: DC

## 2014-12-27 NOTE — MAU Provider Note (Signed)
History     CSN: [id number]  Arrival date and time: 12/27/14 1557   First Provider Initiated Contact with Patient 12/27/14 1636         Chief Complaint  Patient presents with  . Hypertension   HPI  Brittney Mcbride is a 34 y.o. G2P1001 at [redacted]w[redacted]d who presents sent from office for elevated BPs.  Headache since this morning. Rates 8/10; frontal & left occipital headache. No treatment.  Denies changes in vision or epigastric pain.  Denies chest pain, SOB, or palpitations.  Reports irregular contractions.  Denies vaginal bleeding or LOF.  Positive fetal movement.    OB History    Gravida Para Term Preterm AB TAB SAB Ectopic Multiple Living   2 1 1  0 0 0 0 0 0 1      Past Medical History  Diagnosis Date  . HSV-2 (herpes simplex virus 2) infection   . Allergy   . Fibroid   . Infection     UTI    Past Surgical History  Procedure Laterality Date  . Wisdom tooth extraction  2000    Family History  Problem Relation Age of Onset  . Diabetes Father   . Asthma Brother   . Diabetes Brother   . Early death Brother   . Drug abuse Maternal Aunt   . Drug abuse Paternal Uncle   . Alcohol abuse Maternal Grandmother   . Asthma Maternal Grandmother   . Alzheimer's disease Maternal Grandmother   . Alcohol abuse Maternal Grandfather   . Asthma Maternal Grandfather   . Alcohol abuse Paternal Grandmother   . Diabetes Paternal Grandmother   . Alcohol abuse Paternal Grandfather     Social History  Substance Use Topics  . Smoking status: Never Smoker   . Smokeless tobacco: Never Used  . Alcohol Use: No    Allergies:  Allergies  Allergen Reactions  . Latex Itching    Prescriptions prior to admission  Medication Sig Dispense Refill Last Dose  . docusate sodium (COLACE) 100 MG capsule Take 1 capsule (100 mg total) by mouth at bedtime. 30 capsule 11 12/26/2014 at Unknown time  . famotidine (PEPCID) 20 MG tablet Take 1 tablet (20 mg total) by mouth 2 (two) times daily. 60  tablet 11 Past Week at Unknown time  . Iron TABS Take 1 tablet by mouth daily.   12/27/2014 at Unknown time  . phenylephrine-shark liver oil-mineral oil-petrolatum (PREPARATION H) 0.25-3-14-71.9 % rectal ointment Place 1 application rectally 2 (two) times daily as needed for hemorrhoids.   12/27/2014 at Unknown time  . Prenatal Vit-Fe Fumarate-FA (PRENATAL MULTIVITAMIN) TABS tablet Take 1 tablet by mouth daily at 12 noon.   12/27/2014 at Unknown time  . valACYclovir (VALTREX) 500 MG tablet Take 500 mg by mouth daily.   12/27/2014 at Unknown time  . witch hazel-glycerin (TUCKS) pad Apply 1 application topically as needed for hemorrhoids.   12/27/2014 at Unknown time  . feeding supplement, ENSURE ENLIVE, (ENSURE ENLIVE) LIQD Take 237 mLs by mouth 2 (two) times daily between meals. (Patient not taking: Reported on 11/07/2014) 60 Bottle 12 Not Taking at Unknown time  . [DISCONTINUED] valACYclovir (VALTREX) 500 MG tablet TAKE TWO TABLETS BY MOUTH TWICE DAILY FOR 3 DAYS AS NEEDED EACH  OUTBREAK (Patient not taking: Reported on 12/27/2014) 30 tablet 0 Not Taking at Unknown time    Review of Systems  Constitutional: Negative.   Eyes: Negative for blurred vision.  Respiratory: Negative.   Cardiovascular: Negative.  Gastrointestinal: Positive for nausea and vomiting (r/t hyperemesis). Negative for abdominal pain, diarrhea and constipation.  Genitourinary: Negative.   Neurological: Positive for headaches. Negative for dizziness, sensory change and focal weakness.   Physical Exam   Blood pressure 148/83, pulse 57, temperature 98 F (36.7 C), resp. rate 18, height 5\' 5"  (1.651 m), last menstrual period 03/30/2014.  Patient Vitals for the past 24 hrs:  BP Temp Pulse Resp Height  12/27/14 1703 140/80 mmHg - 65 - -  12/27/14 1647 139/78 mmHg - 61 - -  12/27/14 1632 129/76 mmHg - (!) 58 - -  12/27/14 1622 132/92 mmHg - 62 - -  12/27/14 1605 148/83 mmHg 98 F (36.7 C) (!) 57 18 5\' 5"  (1.651 m)      Physical Exam  Nursing note and vitals reviewed. Constitutional: She is oriented to person, place, and time. She appears well-developed and well-nourished. No distress.  HENT:  Head: Normocephalic and atraumatic.  Eyes: Conjunctivae are normal. Right eye exhibits no discharge. Left eye exhibits no discharge. No scleral icterus.  Neck: Normal range of motion.  Cardiovascular: Normal rate, regular rhythm and normal heart sounds.   No murmur heard. Respiratory: Effort normal and breath sounds normal. No respiratory distress. She has no wheezes.  GI: Soft.  Musculoskeletal: Normal range of motion. She exhibits edema (BLE).  Neurological: She is alert and oriented to person, place, and time. She has normal reflexes.  No clonus  Skin: Skin is warm and dry. She is not diaphoretic.  Psychiatric: She has a normal mood and affect. Her behavior is normal. Judgment and thought content normal.   Dilation: 2 Effacement (%): 60 Cervical Position: Posterior Station: -3 Presentation: Vertex Exam by:: Glenford Peers, RNC  Fetal Tracing:  Baseline: 140 Variability:moderate Accelerations: 15x15 Decelerations: none  Toco: 3-5, palpate mild   MAU Course  Procedures Results for orders placed or performed during the hospital encounter of 12/27/14 (from the past 24 hour(s))  Urinalysis, Routine w reflex microscopic (not at [facility])     Status: Abnormal   Collection Time: 12/27/14  4:05 PM  Result Value Ref Range   Color, Urine YELLOW YELLOW   APPearance CLOUDY (A) CLEAR   Specific Gravity, Urine 1.020 1.005 - 1.030   pH 6.5 5.0 - 8.0   Glucose, UA NEGATIVE NEGATIVE mg/dL   Hgb urine dipstick TRACE (A) NEGATIVE   Bilirubin Urine SMALL (A) NEGATIVE   Ketones, ur >80 (A) NEGATIVE mg/dL   Protein, ur NEGATIVE NEGATIVE mg/dL   Urobilinogen, UA 0.2 0.0 - 1.0 mg/dL   Nitrite NEGATIVE NEGATIVE   Leukocytes, UA TRACE (A) NEGATIVE  Protein / creatinine ratio, urine     Status: Abnormal    Collection Time: 12/27/14  4:05 PM  Result Value Ref Range   Creatinine, Urine 257.00 mg/dL   Total Protein, Urine 47 mg/dL   Protein Creatinine Ratio 0.18 (H) 0.00 - 0.15 mg/mg[Cre]  Urine microscopic-add on     Status: Abnormal   Collection Time: 12/27/14  4:05 PM  Result Value Ref Range   Squamous Epithelial / LPF MANY (A) RARE   WBC, UA 3-6 <3 WBC/hpf   RBC / HPF 0-2 <3 RBC/hpf   Bacteria, UA MANY (A) RARE  CBC     Status: Abnormal   Collection Time: 12/27/14  4:27 PM  Result Value Ref Range   WBC 8.0 4.0 - 10.5 K/uL   RBC 3.62 (L) 3.87 - 5.11 MIL/uL   Hemoglobin 7.8 (L) 12.0 - 15.0 g/dL  HCT 25.4 (L) 36.0 - 46.0 %   MCV 70.2 (L) 78.0 - 100.0 fL   MCH 21.5 (L) 26.0 - 34.0 pg   MCHC 30.7 30.0 - 36.0 g/dL   RDW 17.7 (H) 11.5 - 15.5 %   Platelets 151 150 - 400 K/uL  Comprehensive metabolic panel     Status: Abnormal   Collection Time: 12/27/14  4:27 PM  Result Value Ref Range   Sodium 136 135 - 145 mmol/L   Potassium 2.7 (LL) 3.5 - 5.1 mmol/L   Chloride 106 101 - 111 mmol/L   CO2 22 22 - 32 mmol/L   Glucose, Bld 82 65 - 99 mg/dL   BUN <5 (L) 6 - 20 mg/dL   Creatinine, Ser 0.60 0.44 - 1.00 mg/dL   Calcium 8.9 8.9 - 10.3 mg/dL   Total Protein 6.7 6.5 - 8.1 g/dL   Albumin 2.9 (L) 3.5 - 5.0 g/dL   AST 22 15 - 41 U/L   ALT 11 (L) 14 - 54 U/L   Alkaline Phosphatase 124 38 - 126 U/L   Total Bilirubin 1.0 0.3 - 1.2 mg/dL   GFR calc non Af Amer >60 >60 mL/min   GFR calc Af Amer >60 >60 mL/min   Anion gap 8 5 - 15  Lactate dehydrogenase     Status: None   Collection Time: 12/27/14  4:27 PM  Result Value Ref Range   LDH 139 98 - 192 U/L  Uric acid     Status: None   Collection Time: 12/27/14  4:27 PM  Result Value Ref Range   Uric Acid, Serum 4.4 2.3 - 6.6 mg/dL    MDM PO tylenol - mild relief of headache 1738 - S/w Dr. Jodi Mourning; discussed labs. Give IV fluids, meal tray, fioricet, & Kdur Pt vomited after eating meal tray - d/c'd fioricet, ordered zofran IV.  Kdur given  - pt tolerated  Assessment and Plan  A: 1. Gestational hypertension w/o significant proteinuria in 3rd trimester   2. Headache in pregnancy, antepartum, third trimester   3. Hypokalemia   4. Vomiting in late pregnancy, not delivered    P: Discharge home Rx kdur x 3 doses Return to [facility]U on Saturday for bloodwork & BP check Appt @ [facility] on Monday 10am Discussed reasons to return to [place], NP  12/27/2014, 4:36 PM

## 2014-12-27 NOTE — MAU Note (Signed)
Pt presents to MAU from Dr Jacelyn Grip office for an increase in blood pressure. Reports a headache, denies any vaginal bleeding or LOF

## 2014-12-27 NOTE — Discharge Instructions (Signed)
Hypertension During Pregnancy  Hypertension, or high blood pressure, is when there is extra pressure inside your blood vessels that carry blood from the heart to the rest of your body (arteries). It can happen at any time in life, including pregnancy. Hypertension during pregnancy can cause problems for you and your baby. Your baby might not weigh as much as he or she should at birth or might be born early (premature). Very bad cases of hypertension during pregnancy can be life-threatening.   Different types of hypertension can occur during pregnancy. These include:  · Chronic hypertension. This happens when a woman has hypertension before pregnancy and it continues during pregnancy.  · Gestational hypertension. This is when hypertension develops during pregnancy.  · Preeclampsia or toxemia of pregnancy. This is a very serious type of hypertension that develops only during pregnancy. It affects the whole body and can be very dangerous for both mother and baby.    Gestational hypertension and preeclampsia usually go away after your baby is born. Your blood pressure will likely stabilize within 6 weeks. Women who have hypertension during pregnancy have a greater chance of developing hypertension later in life or with future pregnancies.  RISK FACTORS  There are certain factors that make it more likely for you to develop hypertension during pregnancy. These include:  · Having hypertension before pregnancy.  · Having hypertension during a previous pregnancy.  · Being overweight.  · Being older than 40 years.  · Being pregnant with more than one baby.  · Having diabetes or kidney problems.  SIGNS AND SYMPTOMS  Chronic and gestational hypertension rarely cause symptoms. Preeclampsia has symptoms, which may include:  · Increased protein in your urine. Your health care provider will check for this at every prenatal visit.  · Swelling of your hands and face.  · Rapid weight gain.  · Headaches.  · Visual changes.  · Being  bothered by light.  · Abdominal pain, especially in the upper right area.  · Chest pain.  · Shortness of breath.  · Increased reflexes.  · Seizures. These occur with a more severe form of preeclampsia, called eclampsia.  DIAGNOSIS   You may be diagnosed with hypertension during a regular prenatal exam. At each prenatal visit, you may have:  · Your blood pressure checked.  · A urine test to check for protein in your urine.  The type of hypertension you are diagnosed with depends on when you developed it. It also depends on your specific blood pressure reading.  · Developing hypertension before 20 weeks of pregnancy is consistent with chronic hypertension.  · Developing hypertension after 20 weeks of pregnancy is consistent with gestational hypertension.  · Hypertension with increased urinary protein is diagnosed as preeclampsia.  · Blood pressure measurements that stay above 160 systolic or 110 diastolic are a sign of severe preeclampsia.  TREATMENT  Treatment for hypertension during pregnancy varies. Treatment depends on the type of hypertension and how serious it is.  · If you take medicine for chronic hypertension, you may need to switch medicines.    Medicines called ACE inhibitors should not be taken during pregnancy.    Low-dose aspirin may be suggested for women who have risk factors for preeclampsia.  · If you have gestational hypertension, you may need to take a blood pressure medicine that is safe during pregnancy. Your health care provider will recommend the correct medicine.  · If you have severe preeclampsia, you may need to be in the hospital. Health care   done to protect you and your baby. The only cure for preeclampsia is delivery.  Your health  care provider may recommend that you take one low-dose aspirin (81 mg) each day to help prevent high blood pressure during your pregnancy if you are at risk for preeclampsia. You may be at risk for preeclampsia if:  You had preeclampsia or eclampsia during a previous pregnancy.  Your baby did not grow as expected during a previous pregnancy.  You experienced preterm birth with a previous pregnancy.  You experienced a separation of the placenta from the uterus (placental abruption) during a previous pregnancy.  You experienced the loss of your baby during a previous pregnancy.  You are pregnant with more than one baby.  You have other medical conditions, such as diabetes or an autoimmune disease. HOME CARE INSTRUCTIONS  Schedule and keep all of your regular prenatal care appointments. This is important.  Take medicines only as directed by your health care provider. Tell your health care provider about all medicines you take.  Eat as little salt as possible.  Get regular exercise.  Do not drink alcohol.  Do not use tobacco products.  Do not drink products with caffeine.  Lie on your left side when resting. SEEK IMMEDIATE MEDICAL CARE IF:  You have severe abdominal pain.  You have sudden swelling in your hands, ankles, or face.  You gain 4 pounds (1.8 kg) or more in 1 week.  You vomit repeatedly.  You have vaginal bleeding.  You do not feel your baby moving as much.  You have a headache.  You have blurred or double vision.  You have muscle twitching or spasms.  You have shortness of breath.  You have blue fingernails or lips.  You have blood in your urine. MAKE SURE YOU:  Understand these instructions.  Will watch your condition.  Will get help right away if you are not doing well or get worse.   This information is not intended to replace advice given to you by your health care provider. Make sure you discuss any questions you have with your health care  provider.   Document Released: 11/04/2010 Document Revised: 03/09/2014 Document Reviewed: 09/15/2012 Elsevier Interactive Patient Education 2016 [employer].    Hypokalemia Hypokalemia means that the amount of potassium in the blood is lower than normal.Potassium is a chemical, called an electrolyte, that helps regulate the amount of fluid in the body. It also stimulates muscle contraction and helps nerves function properly.Most of the body's potassium is inside of cells, and only a very small amount is in the blood. Because the amount in the blood is so small, minor changes can be life-threatening. CAUSES  Antibiotics.  Diarrhea or vomiting.  Using laxatives too much, which can cause diarrhea.  Chronic kidney disease.  Water pills (diuretics).  Eating disorders (bulimia).  Low magnesium level.  Sweating a lot. SIGNS AND SYMPTOMS  Weakness.  Constipation.  Fatigue.  Muscle cramps.  Mental confusion.  Skipped heartbeats or irregular heartbeat (palpitations).  Tingling or numbness. DIAGNOSIS  Your health care provider can diagnose hypokalemia with blood tests. In addition to checking your potassium level, your health care provider may also check other lab tests. TREATMENT Hypokalemia can be treated with potassium supplements taken by mouth or adjustments in your current medicines. If your potassium level is very low, you may need to get potassium through a vein (IV) and be monitored in the hospital. A diet high in potassium is also helpful. Foods high in  potassium are:  Nuts, such as peanuts and pistachios.  Seeds, such as sunflower seeds and pumpkin seeds.  Peas, lentils, and lima beans.  Whole grain and bran cereals and breads.  Fresh fruit and vegetables, such as apricots, avocado, bananas, cantaloupe, kiwi, oranges, tomatoes, asparagus, and potatoes.  Orange and tomato juices.  Red meats.  Fruit yogurt. HOME CARE INSTRUCTIONS  Take all medicines as  prescribed by your health care provider.  Maintain a healthy diet by including nutritious food, such as fruits, vegetables, nuts, whole grains, and lean meats.  If you are taking a laxative, be sure to follow the directions on the label. SEEK MEDICAL CARE IF:  Your weakness gets worse.  You feel your heart pounding or racing.  You are vomiting or having diarrhea.  You are diabetic and having trouble keeping your blood glucose in the normal range. SEEK IMMEDIATE MEDICAL CARE IF:  You have chest pain, shortness of breath, or dizziness.  You are vomiting or having diarrhea for more than 2 days.  You faint. MAKE SURE YOU:   Understand these instructions.  Will watch your condition.  Will get help right away if you are not doing well or get worse.   This information is not intended to replace advice given to you by your health care provider. Make sure you discuss any questions you have with your health care provider.   Document Released: 02/16/2005 Document Revised: 03/09/2014 Document Reviewed: 08/19/2012 Elsevier Interactive Patient Education [employer].

## 2014-12-27 NOTE — MAU Note (Signed)
CRITICAL VALUE ALERT  Critical value received:  Potassium 2.7  Date of notification:  12/27/2014  Time of notification:  1720  Critical value read back:Yes.    Nurse who received alert:  Doug Sou, RN  MD notified (1st page):  Almond Lint, NP  Time of first page:  1721  MD notified (2nd page):  Time of second page:  Responding MD:  Almond Lint, NP  Time MD responded:  [phone]

## 2014-12-27 NOTE — Telephone Encounter (Signed)
Patient awoke with extreme headache - over ear and behind eyes.  2:17- Patient to come to office to let us check it.

## 2014-12-27 NOTE — Progress Notes (Signed)
Pt states that she is having severe HA today.  Pt has slept most of the day and continues to have HA.  Pt is having irregular ctx.  Pt is having numbness in hands.

## 2014-12-28 ENCOUNTER — Encounter: Payer: Self-pay | Admitting: Obstetrics

## 2014-12-28 ENCOUNTER — Telehealth (HOSPITAL_COMMUNITY): Payer: Self-pay | Admitting: *Deleted

## 2014-12-28 ENCOUNTER — Encounter (HOSPITAL_COMMUNITY): Payer: Self-pay | Admitting: *Deleted

## 2014-12-28 NOTE — Telephone Encounter (Signed)
Preadmission screen  

## 2014-12-28 NOTE — Progress Notes (Signed)
Subjective:    Brittney Mcbride is a 34 y.o. female being seen today for her obstetrical visit. She is at [redacted]w[redacted]d gestation. Patient reports headache and occasional contractions. Fetal movement: normal.  Problem List Items Addressed This Visit    None    Visit Diagnoses    Prenatal care, third trimester    -  Primary    Relevant Orders    POCT urinalysis dipstick (Completed)      Patient Active Problem List   Diagnosis Date Noted  . Abnormal antenatal alpha fetoprotein screen   . Abnormal antenatal AFP screen   . [redacted] weeks gestation of pregnancy   . Encounter for fetal anatomic survey   . Obesity affecting pregnancy in second trimester, antepartum   . Hypokalemia 05/28/2014  . Dehydration 05/28/2014  . Hyperemesis gravidarum with electrolyte imbalance 05/28/2014  . Pregnancy 05/03/2014  . Genital herpes, unspecified 06/01/2013  . Hypertriglyceridemia 03/10/2013  . Plantar fasciitis 03/10/2013  . Abscess of Bartholin's gland 01/25/2013  . Exposure to STD 01/12/2013  . Excessive or frequent menstruation 06/29/2012  . Leiomyoma of uterus, unspecified 06/29/2012  . Dysmenorrhea 06/29/2012  . Genital herpes 04/11/2012  . Morbid obesity ([place]) 04/11/2012  . Abdominal pain, other specified site 04/11/2012  . Hyperglycemia 04/11/2012    Objective:    BP 148/90 mmHg  Pulse 71  Wt 291 lb (131.997 kg)  LMP 03/30/2014 FHT: 150 BPM  Uterine Size: size greater than dates  Presentations: cephalic  Pelvic Exam:              Dilation: 1cm       Effacement: 50%             Station:  -3    Consistency: soft            Position: posterior     Assessment:    Pregnancy @ [redacted]w[redacted]d weeks   Plan:   Plans for delivery: Vaginal anticipated; labs reviewed; problem list updated Counseling: Consent signed. Infant feeding: plans to breastfeed. Cigarette smoking: never smoked. L&D discussion: symptoms of labor, discussed when to call, discussed what number to call, anesthetic/analgesic options  reviewed and delivering clinician:  plans no preference. Postpartum supports and preparation: circumcision discussed and contraception plans discussed.  Patient sent to [facility] for further evaluation

## 2014-12-29 ENCOUNTER — Encounter (HOSPITAL_COMMUNITY): Payer: Self-pay | Admitting: Advanced Practice Midwife

## 2014-12-29 ENCOUNTER — Inpatient Hospital Stay (HOSPITAL_COMMUNITY)
Admission: AD | Admit: 2014-12-29 | Discharge: 2015-01-01 | DRG: 775 | Disposition: A | Discharge status: Home / Self Care | Payer: Medicaid Other | Source: Ambulatory Visit | Attending: Obstetrics | Admitting: Obstetrics

## 2014-12-29 DIAGNOSIS — Z6841 Body Mass Index (BMI) 40.0 and over, adult: Secondary | ICD-10-CM

## 2014-12-29 DIAGNOSIS — D649 Anemia, unspecified: Secondary | ICD-10-CM | POA: Diagnosis not present

## 2014-12-29 DIAGNOSIS — Z3A39 39 weeks gestation of pregnancy: Secondary | ICD-10-CM | POA: Diagnosis not present

## 2014-12-29 DIAGNOSIS — O9902 Anemia complicating childbirth: Secondary | ICD-10-CM | POA: Diagnosis present

## 2014-12-29 DIAGNOSIS — O133 Gestational [pregnancy-induced] hypertension without significant proteinuria, third trimester: Secondary | ICD-10-CM | POA: Diagnosis present

## 2014-12-29 DIAGNOSIS — O139 Gestational [pregnancy-induced] hypertension without significant proteinuria, unspecified trimester: Secondary | ICD-10-CM

## 2014-12-29 DIAGNOSIS — O99214 Obesity complicating childbirth: Secondary | ICD-10-CM | POA: Diagnosis present

## 2014-12-29 DIAGNOSIS — O164 Unspecified maternal hypertension, complicating childbirth: Secondary | ICD-10-CM | POA: Diagnosis present

## 2014-12-29 DIAGNOSIS — O99013 Anemia complicating pregnancy, third trimester: Secondary | ICD-10-CM

## 2014-12-29 DIAGNOSIS — O99824 Streptococcus B carrier state complicating childbirth: Secondary | ICD-10-CM | POA: Diagnosis present

## 2014-12-29 HISTORY — DX: Gestational (pregnancy-induced) hypertension without significant proteinuria, unspecified trimester: O13.9

## 2014-12-29 HISTORY — DX: Gestational (pregnancy-induced) hypertension without significant proteinuria, third trimester: O13.3

## 2014-12-29 LAB — ALT: ALT: 8 U/L — ABNORMAL LOW (ref 14–54)

## 2014-12-29 LAB — BASIC METABOLIC PANEL
Anion gap: 4 — ABNORMAL LOW (ref 5–15)
CHLORIDE: 109 mmol/L (ref 101–111)
CO2: 24 mmol/L (ref 22–32)
CREATININE: 0.63 mg/dL (ref 0.44–1.00)
Calcium: 8.6 mg/dL — ABNORMAL LOW (ref 8.9–10.3)
GFR calc Af Amer: >60 mL/min (ref >60)
GFR calc non Af Amer: >60 mL/min (ref >60)
GLUCOSE: 91 mg/dL (ref 65–99)
Potassium: 3.3 mmol/L — ABNORMAL LOW (ref 3.5–5.1)
SODIUM: 137 mmol/L (ref 135–145)

## 2014-12-29 LAB — URINE MICROSCOPIC-ADD ON

## 2014-12-29 LAB — URINALYSIS, ROUTINE W REFLEX MICROSCOPIC
Glucose, UA: NEGATIVE mg/dL
Hgb urine dipstick: NEGATIVE
Ketones, ur: 40 mg/dL — AB
NITRITE: NEGATIVE
PROTEIN: 30 mg/dL — AB
SPECIFIC GRAVITY, URINE: 1.025 (ref 1.005–1.030)
UROBILINOGEN UA: 2 mg/dL — AB (ref 0.0–1.0)
pH: 6.5 (ref 5.0–8.0)

## 2014-12-29 LAB — CBC
HEMATOCRIT: 24.1 % — AB (ref 36.0–46.0)
Hemoglobin: 7.3 g/dL — ABNORMAL LOW (ref 12.0–15.0)
MCH: 21.6 pg — ABNORMAL LOW (ref 26.0–34.0)
MCHC: 30.3 g/dL (ref 30.0–36.0)
MCV: 71.3 fL — AB (ref 78.0–100.0)
Platelets: 131 10*3/uL — ABNORMAL LOW (ref 150–400)
RBC: 3.38 MIL/uL — ABNORMAL LOW (ref 3.87–5.11)
RDW: 18.7 % — AB (ref 11.5–15.5)
WBC: 8.2 10*3/uL (ref 4.0–10.5)

## 2014-12-29 LAB — ABO/RH: ABO/RH(D): O POS

## 2014-12-29 LAB — AST: AST: 23 U/L (ref 15–41)

## 2014-12-29 LAB — PROTEIN / CREATININE RATIO, URINE
Creatinine, Urine: 390 mg/dL
Protein Creatinine Ratio: 0.17 mg/mg{Cre} — ABNORMAL HIGH (ref 0.00–0.15)
Total Protein, Urine: 65 mg/dL

## 2014-12-29 LAB — PREPARE RBC (CROSSMATCH)

## 2014-12-29 MED ORDER — LIDOCAINE HCL (PF) 1 % IJ SOLN
30.0000 mL | INTRAMUSCULAR | Status: DC | PRN
  Administered 2014-12-30: 30 mL via SUBCUTANEOUS
  Filled 2014-12-29: qty 30

## 2014-12-29 MED ORDER — OXYTOCIN BOLUS FROM INFUSION
500.0000 mL | INTRAVENOUS | Status: DC
  Administered 2014-12-30: 500 mL via INTRAVENOUS

## 2014-12-29 MED ORDER — LACTATED RINGERS IV SOLN
INTRAVENOUS | Status: DC
  Administered 2014-12-29 – 2014-12-30 (×2): via INTRAVENOUS

## 2014-12-29 MED ORDER — TERBUTALINE SULFATE 1 MG/ML IJ SOLN
0.2500 mg | Freq: Once | INTRAMUSCULAR | Status: DC | PRN
  Filled 2014-12-29: qty 1

## 2014-12-29 MED ORDER — PENICILLIN G POTASSIUM 5000000 UNITS IJ SOLR
5.0000 10*6.[IU] | Freq: Once | INTRAVENOUS | Status: AC
  Administered 2014-12-30: 5 10*6.[IU] via INTRAVENOUS
  Filled 2014-12-29: qty 5

## 2014-12-29 MED ORDER — MISOPROSTOL 25 MCG QUARTER TABLET
25.0000 ug | ORAL_TABLET | ORAL | Status: DC | PRN
  Administered 2014-12-29: 25 ug via VAGINAL
  Filled 2014-12-29: qty 1
  Filled 2014-12-29: qty 0.25

## 2014-12-29 MED ORDER — LACTATED RINGERS IV SOLN
500.0000 mL | INTRAVENOUS | Status: DC | PRN
  Administered 2014-12-30: 500 mL via INTRAVENOUS

## 2014-12-29 MED ORDER — CITRIC ACID-SODIUM CITRATE 334-500 MG/5ML PO SOLN: 30.0000 mL | ORAL | Status: DC | PRN

## 2014-12-29 MED ORDER — ONDANSETRON HCL 4 MG/2ML IJ SOLN: 4.0000 mg | Freq: Four times a day (QID) | INTRAMUSCULAR | Status: DC | PRN

## 2014-12-29 MED ORDER — BUTORPHANOL TARTRATE 1 MG/ML IJ SOLN: 1.0000 mg | INTRAMUSCULAR | Status: DC | PRN

## 2014-12-29 MED ORDER — OXYCODONE-ACETAMINOPHEN 5-325 MG PO TABS
1.0000 | ORAL_TABLET | ORAL | Status: DC | PRN
  Administered 2014-12-30 – 2014-12-31 (×2): 1 via ORAL

## 2014-12-29 MED ORDER — PENICILLIN G POTASSIUM 5000000 UNITS IJ SOLR
2.5000 10*6.[IU] | INTRAVENOUS | Status: DC
  Administered 2014-12-30: 2.5 10*6.[IU] via INTRAVENOUS
  Filled 2014-12-29 (×5): qty 2.5

## 2014-12-29 MED ORDER — FLEET ENEMA 7-19 GM/118ML RE ENEM: 1.0000 | ENEMA | RECTAL | Status: DC | PRN

## 2014-12-29 MED ORDER — OXYCODONE-ACETAMINOPHEN 5-325 MG PO TABS: 2.0000 | ORAL_TABLET | ORAL | Status: DC | PRN

## 2014-12-29 MED ORDER — ZOLPIDEM TARTRATE 5 MG PO TABS: 5.0000 mg | ORAL_TABLET | Freq: Every evening | ORAL | Status: DC | PRN

## 2014-12-29 MED ORDER — OXYTOCIN 40 UNITS IN LACTATED RINGERS INFUSION - SIMPLE MED
62.5000 mL/h | INTRAVENOUS | Status: DC
  Filled 2014-12-29: qty 1000

## 2014-12-29 MED ORDER — ACETAMINOPHEN 325 MG PO TABS: 650.0000 mg | ORAL_TABLET | ORAL | Status: DC | PRN

## 2014-12-29 NOTE — MAU Note (Signed)
Dr Ruthann Cancer called and aware of pt's delay in going to [facility]. Pt may have soft diet

## 2014-12-29 NOTE — MAU Note (Signed)
Pt to Xray lobby with family to eat soft diet.

## 2014-12-29 NOTE — MAU Note (Signed)
Pt seen in MAU on Thursday for HTN, potassium was also low - was told to come to be reevaluated today.  Pt states she is having uc's & back pain, denies bleeding or LOF.  Denies HA, visual changes or epigastric pain.

## 2014-12-29 NOTE — MAU Provider Note (Signed)
Chief Complaint:  No chief complaint on file.  First Provider Initiated Contact with Patient 12/29/14 1417     HPI: Brittney Mcbride is a 34 y.o. G2P1001 at [redacted]w[redacted]d who presents to maternity admissions for recheck of potassium and BP. Potassium was 2.7 on 10/28 (incidental finding on CMET). Taking Potassium Chloride as instructed. Elevated BP 10/27 and 10/28 w/ out significant proteinuria meeting criteria for GHTN. Had severe HA a few days ago, but none since. Denies vision changes or epigastric pain. Has IOL scheduled 01/01/15.   Mild contractions. Denies leakage of fluid or vaginal bleeding. Good fetal movement.   Past Medical History: Past Medical History  Diagnosis Date  . HSV-2 (herpes simplex virus 2) infection   . Allergy   . Fibroid   . Infection     UTI    Past obstetric history: OB History  Gravida Para Term Preterm AB SAB TAB Ectopic Multiple Living  2 1 1  0 0 0 0 0 0 1    # Outcome Date GA Lbr Len/2nd Weight Sex Delivery Anes PTL Lv  2 Current           1 Term 2009    M Vag-Spont EPI N Y      Past Surgical History: Past Surgical History  Procedure Laterality Date  . Wisdom tooth extraction  2000     Family History: Family History  Problem Relation Age of Onset  . Diabetes Father   . Asthma Brother   . Diabetes Brother   . Early death Brother   . Drug abuse Maternal Aunt   . Drug abuse Paternal Uncle   . Alcohol abuse Maternal Grandmother   . Asthma Maternal Grandmother   . Alzheimer's disease Maternal Grandmother   . Alcohol abuse Maternal Grandfather   . Asthma Maternal Grandfather   . Alcohol abuse Paternal Grandmother   . Diabetes Paternal Grandmother   . Alcohol abuse Paternal Grandfather     Social History: Social History  Substance Use Topics  . Smoking status: Never Smoker   . Smokeless tobacco: Never Used  . Alcohol Use: No    Allergies:  Allergies  Allergen Reactions  . Latex Itching    Meds:  Prescriptions prior to admission   Medication Sig Dispense Refill Last Dose  . docusate sodium (COLACE) 100 MG capsule Take 1 capsule (100 mg total) by mouth at bedtime. 30 capsule 11 12/26/2014 at Unknown time  . famotidine (PEPCID) 20 MG tablet Take 1 tablet (20 mg total) by mouth 2 (two) times daily. 60 tablet 11 Past Week at Unknown time  . Iron TABS Take 1 tablet by mouth daily.   12/27/2014 at Unknown time  . phenylephrine-shark liver oil-mineral oil-petrolatum (PREPARATION H) 0.25-3-14-71.9 % rectal ointment Place 1 application rectally 2 (two) times daily as needed for hemorrhoids.   12/27/2014 at Unknown time  . potassium chloride 20 MEQ TBCR Take 40 mEq by mouth every 8 (eight) hours. 6 tablet 0   . Prenatal Vit-Fe Fumarate-FA (PRENATAL MULTIVITAMIN) TABS tablet Take 1 tablet by mouth daily at 12 noon.   12/27/2014 at Unknown time  . valACYclovir (VALTREX) 500 MG tablet Take 500 mg by mouth daily.   12/27/2014 at Unknown time  . witch hazel-glycerin (TUCKS) pad Apply 1 application topically as needed for hemorrhoids.   12/27/2014 at Unknown time    I have reviewed patient's Past Medical Hx, Surgical Hx, Family Hx, Social Hx, medications and allergies.   ROS:  Review of Systems  Eyes: Negative for visual disturbance.  Gastrointestinal: Positive for abdominal pain (mild contractions.).  Genitourinary: Negative for vaginal bleeding.       Neg LOF    Physical Exam   Patient Vitals for the past 24 hrs:  BP Temp Temp src Pulse Resp  12/29/14 1442 143/83 mmHg - - 60 -  12/29/14 1432 159/89 mmHg - - 62 -  12/29/14 1422 157/83 mmHg - - (!) 57 -  12/29/14 1414 152/88 mmHg - - 61 -  12/29/14 1332 158/84 mmHg - - 62 -  12/29/14 1238 150/80 mmHg 98.2 F (36.8 C) Oral 64 18   Constitutional: Well-developed, well-nourished, obese female in no acute distress.  Cardiovascular: normal rate Respiratory: normal effort GI: Abd soft, non-tender, gravid appropriate for gestational age. MS: Extremities nontender, 1+ edema,  normal ROM Neurologic: Alert and oriented x 4. DTRs 2+, no clonus Pelvic: NEFG, physiologic discharge, no blood.  Dilation: 3.5 Effacement (%): Thick Station: -2 Presentation: Vertex Exam by:: Marlou Porch, CNM  FHT:  Baseline 140 , moderate variability, accelerations present, no decelerations Contractions: irreg, mild   Labs: Results for orders placed or performed during the hospital encounter of 12/29/14 (from the past 24 hour(s))  Basic metabolic panel     Status: Abnormal   Collection Time: 12/29/14 12:49 PM  Result Value Ref Range   Sodium 137 135 - 145 mmol/L   Potassium 3.3 (L) 3.5 - 5.1 mmol/L   Chloride 109 101 - 111 mmol/L   CO2 24 22 - 32 mmol/L   Glucose, Bld 91 65 - 99 mg/dL   BUN <5 (L) 6 - 20 mg/dL   Creatinine, Ser 0.63 0.44 - 1.00 mg/dL   Calcium 8.6 (L) 8.9 - 10.3 mg/dL   GFR calc non Af Amer >60 >60 mL/min   GFR calc Af Amer >60 >60 mL/min   Anion gap 4 (L) 5 - 15  Protein / creatinine ratio, urine     Status: Abnormal   Collection Time: 12/29/14  2:05 PM  Result Value Ref Range   Creatinine, Urine 390.00 mg/dL   Total Protein, Urine 65 mg/dL   Protein Creatinine Ratio 0.17 (H) 0.00 - 0.15 mg/mg[Cre]  Urinalysis, Routine w reflex microscopic (not at [facility])     Status: Abnormal   Collection Time: 12/29/14  2:05 PM  Result Value Ref Range   Color, Urine YELLOW YELLOW   APPearance HAZY (A) CLEAR   Specific Gravity, Urine 1.025 1.005 - 1.030   pH 6.5 5.0 - 8.0   Glucose, UA NEGATIVE NEGATIVE mg/dL   Hgb urine dipstick NEGATIVE NEGATIVE   Bilirubin Urine SMALL (A) NEGATIVE   Ketones, ur 40 (A) NEGATIVE mg/dL   Protein, ur 30 (A) NEGATIVE mg/dL   Urobilinogen, UA 2.0 (H) 0.0 - 1.0 mg/dL   Nitrite NEGATIVE NEGATIVE   Leukocytes, UA TRACE (A) NEGATIVE  Urine microscopic-add on     Status: Abnormal   Collection Time: 12/29/14  2:05 PM  Result Value Ref Range   Squamous Epithelial / LPF MANY (A) RARE   WBC, UA 11-20 <3 WBC/hpf   Bacteria, UA MANY (A) RARE    Crystals CA OXALATE CRYSTALS (A) NEGATIVE   Urine-Other MUCOUS PRESENT   CBC     Status: Abnormal   Collection Time: 12/29/14  2:11 PM  Result Value Ref Range   WBC 8.2 4.0 - 10.5 K/uL   RBC 3.38 (L) 3.87 - 5.11 MIL/uL   Hemoglobin 7.3 (L) 12.0 - 15.0 g/dL  HCT 24.1 (L) 36.0 - 46.0 %   MCV 71.3 (L) 78.0 - 100.0 fL   MCH 21.6 (L) 26.0 - 34.0 pg   MCHC 30.3 30.0 - 36.0 g/dL   RDW 18.7 (H) 11.5 - 15.5 %   Platelets 131 (L) 150 - 400 K/uL  AST     Status: None   Collection Time: 12/29/14  2:11 PM  Result Value Ref Range   AST 23 15 - 41 U/L  ALT     Status: Abnormal   Collection Time: 12/29/14  2:11 PM  Result Value Ref Range   ALT 8 (L) 14 - 54 U/L    Imaging:  [place] Mfm Ob Follow Up  12/17/2014  OBSTETRICAL ULTRASOUND: This exam was performed within a [place] Ultrasound Department. The OB US report was generated in the AS system, and faxed to the ordering physician.  This report is available in the [employer]. See the AS Obstetric US report via the Image Link.   MAU Course: CMET, CBC, P:C, cycle BPs  Discussed BPs, labs, development of GHTN since 10/27 w/ Dr. Ruthann Cancer. Specifically mentioned Ptl 131 and Hgb 7.3. Will admit for IOL for GHTN.   MDM: Meets criteria for GHTN.  Assessment: 1. Gestational hypertension without significant proteinuria in third trimester   2. Anemia complicating pregnancy in third trimester   3. Fetal Wellbeing: Category I  4. GBS: Pos 5. 39.1 week IUP  Plan:  1. Admit to BS per consult with Dr. Ruthann Cancer 2. Routine L&D orders 3.Cytotec    [place], [place] 12/29/2014 2:13 PM

## 2014-12-29 NOTE — Progress Notes (Signed)
Patient wants to eat. Was advised to wait for room assignment and plan/time frame. Patient requests to go and sit in lobby with family. L&D called several times for room status. Currently no room assignment offered. Waiting for another call back. Patient allowed to go to lobby with family.

## 2014-12-30 ENCOUNTER — Encounter (HOSPITAL_COMMUNITY): Payer: Self-pay

## 2014-12-30 ENCOUNTER — Inpatient Hospital Stay (HOSPITAL_COMMUNITY): Payer: Medicaid Other | Admitting: Anesthesiology

## 2014-12-30 LAB — CBC
HCT: 23 % — ABNORMAL LOW (ref 36.0–46.0)
HEMATOCRIT: 26.1 % — AB (ref 36.0–46.0)
Hemoglobin: 8 g/dL — ABNORMAL LOW (ref 12.0–15.0)
Hemoglobin: 7.1 g/dL — ABNORMAL LOW (ref 12.0–15.0)
MCH: 21.6 pg — ABNORMAL LOW (ref 26.0–34.0)
MCH: 21.8 pg — ABNORMAL LOW (ref 26.0–34.0)
MCHC: 30.7 g/dL (ref 30.0–36.0)
MCHC: 30.9 g/dL (ref 30.0–36.0)
MCV: 70.5 fL — AB (ref 78.0–100.0)
MCV: 70.6 fL — AB (ref 78.0–100.0)
PLATELETS: 134 10*3/uL — AB (ref 150–400)
Platelets: 156 10*3/uL (ref 150–400)
RBC: 3.7 MIL/uL — ABNORMAL LOW (ref 3.87–5.11)
RBC: 3.26 MIL/uL — AB (ref 3.87–5.11)
RDW: 18 % — AB (ref 11.5–15.5)
RDW: 18.1 % — ABNORMAL HIGH (ref 11.5–15.5)
WBC: 8.3 10*3/uL (ref 4.0–10.5)
WBC: 9.4 10*3/uL (ref 4.0–10.5)

## 2014-12-30 MED ORDER — BENZOCAINE-MENTHOL 20-0.5 % EX AERO
1.0000 "application " | INHALATION_SPRAY | CUTANEOUS | Status: DC | PRN
  Administered 2014-12-30: 1 via TOPICAL
  Filled 2014-12-30: qty 56

## 2014-12-30 MED ORDER — DIPHENHYDRAMINE HCL 25 MG PO CAPS: 25.0000 mg | ORAL_CAPSULE | Freq: Four times a day (QID) | ORAL | Status: DC | PRN

## 2014-12-30 MED ORDER — FENTANYL 2.5 MCG/ML BUPIVACAINE 1/10 % EPIDURAL INFUSION (WH - ANES)
14.0000 mL/h | INTRAMUSCULAR | Status: DC | PRN
  Administered 2014-12-30 (×2): 14 mL/h via EPIDURAL
  Filled 2014-12-30: qty 125

## 2014-12-30 MED ORDER — LANOLIN HYDROUS EX OINT: TOPICAL_OINTMENT | CUTANEOUS | Status: DC | PRN

## 2014-12-30 MED ORDER — DIPHENHYDRAMINE HCL 50 MG/ML IJ SOLN: 12.5000 mg | INTRAMUSCULAR | Status: DC | PRN

## 2014-12-30 MED ORDER — BUPIVACAINE HCL (PF) 0.25 % IJ SOLN
INTRAMUSCULAR | Status: DC | PRN
  Administered 2014-12-30: 8 mL via EPIDURAL

## 2014-12-30 MED ORDER — WITCH HAZEL-GLYCERIN EX PADS
1.0000 "application " | MEDICATED_PAD | CUTANEOUS | Status: DC | PRN
  Administered 2014-12-30: 1 via TOPICAL

## 2014-12-30 MED ORDER — EPHEDRINE 5 MG/ML INJ
10.0000 mg | INTRAVENOUS | Status: DC | PRN
  Filled 2014-12-30: qty 2

## 2014-12-30 MED ORDER — IBUPROFEN 600 MG PO TABS
600.0000 mg | ORAL_TABLET | Freq: Four times a day (QID) | ORAL | Status: DC
  Administered 2014-12-30 – 2015-01-01 (×9): 600 mg via ORAL
  Filled 2014-12-30 (×9): qty 1

## 2014-12-30 MED ORDER — PHENYLEPHRINE 40 MCG/ML (10ML) SYRINGE FOR IV PUSH (FOR BLOOD PRESSURE SUPPORT)
80.0000 ug | PREFILLED_SYRINGE | INTRAVENOUS | Status: DC | PRN
  Filled 2014-12-30: qty 2
  Filled 2014-12-30: qty 20

## 2014-12-30 MED ORDER — OXYCODONE-ACETAMINOPHEN 5-325 MG PO TABS: 2.0000 | ORAL_TABLET | ORAL | Status: DC | PRN

## 2014-12-30 MED ORDER — SIMETHICONE 80 MG PO CHEW: 80.0000 mg | CHEWABLE_TABLET | ORAL | Status: DC | PRN

## 2014-12-30 MED ORDER — ZOLPIDEM TARTRATE 5 MG PO TABS: 5.0000 mg | ORAL_TABLET | Freq: Every evening | ORAL | Status: DC | PRN

## 2014-12-30 MED ORDER — OXYTOCIN 40 UNITS IN LACTATED RINGERS INFUSION - SIMPLE MED
INTRAVENOUS | Status: AC
  Administered 2014-12-30: 40 [IU]
  Filled 2014-12-30: qty 1000

## 2014-12-30 MED ORDER — TETANUS-DIPHTH-ACELL PERTUSSIS 5-2.5-18.5 LF-MCG/0.5 IM SUSP
0.5000 mL | Freq: Once | INTRAMUSCULAR | Status: AC
  Administered 2014-12-31: 0.5 mL via INTRAMUSCULAR
  Filled 2014-12-30: qty 0.5

## 2014-12-30 MED ORDER — FERROUS SULFATE 325 (65 FE) MG PO TABS: 325.0000 mg | ORAL_TABLET | Freq: Two times a day (BID) | ORAL | Status: DC

## 2014-12-30 MED ORDER — DIBUCAINE 1 % RE OINT
1.0000 "application " | TOPICAL_OINTMENT | RECTAL | Status: DC | PRN
  Administered 2014-12-30: 1 via RECTAL
  Filled 2014-12-30: qty 28

## 2014-12-30 MED ORDER — FERROUS SULFATE 325 (65 FE) MG PO TABS
325.0000 mg | ORAL_TABLET | Freq: Two times a day (BID) | ORAL | Status: DC
  Administered 2014-12-30 – 2015-01-01 (×4): 325 mg via ORAL
  Filled 2014-12-30 (×5): qty 1

## 2014-12-30 MED ORDER — ONDANSETRON HCL 4 MG/2ML IJ SOLN: 4.0000 mg | INTRAMUSCULAR | Status: DC | PRN

## 2014-12-30 MED ORDER — PRENATAL MULTIVITAMIN CH
1.0000 | ORAL_TABLET | Freq: Every day | ORAL | Status: DC
  Administered 2014-12-31 – 2015-01-01 (×2): 1 via ORAL
  Filled 2014-12-30 (×2): qty 1

## 2014-12-30 MED ORDER — SENNOSIDES-DOCUSATE SODIUM 8.6-50 MG PO TABS
2.0000 | ORAL_TABLET | ORAL | Status: DC
  Administered 2014-12-30 – 2015-01-01 (×2): 2 via ORAL
  Filled 2014-12-30 (×2): qty 2

## 2014-12-30 MED ORDER — ONDANSETRON HCL 4 MG PO TABS: 4.0000 mg | ORAL_TABLET | ORAL | Status: DC | PRN

## 2014-12-30 MED ORDER — ACETAMINOPHEN 325 MG PO TABS
650.0000 mg | ORAL_TABLET | ORAL | Status: DC | PRN
  Administered 2014-12-31: 650 mg via ORAL
  Filled 2014-12-30: qty 2

## 2014-12-30 MED ORDER — OXYCODONE-ACETAMINOPHEN 5-325 MG PO TABS
1.0000 | ORAL_TABLET | ORAL | Status: DC | PRN
  Filled 2014-12-30 (×2): qty 1

## 2014-12-30 MED ORDER — LIDOCAINE HCL (PF) 1 % IJ SOLN
INTRAMUSCULAR | Status: DC | PRN
  Administered 2014-12-30 (×2): 5 mL

## 2014-12-30 NOTE — Progress Notes (Signed)
Pt repositions self for comfort. Continuous FHR surveillance interrupted with maternal position change. RN to bedside, EFM adjusted.

## 2014-12-30 NOTE — Anesthesia Postprocedure Evaluation (Signed)
Anesthesia Post Note  Patient: Brittney Mcbride  Procedure(s) Performed: * No procedures listed *  Anesthesia type: Epidural  Patient location: Mother/Baby  Post pain: Pain level controlled  Post assessment: Post-op Vital signs reviewed  Last Vitals:  Filed Vitals:   12/30/14 1800  BP: 146/87  Pulse: 66  Temp: 36.9 C  Resp: 16    Post vital signs: Reviewed  Level of consciousness:alert  Complications: No apparent anesthesia complications

## 2014-12-30 NOTE — Progress Notes (Signed)
Dr. Ruthann Cancer called and made aware of 1161 EBL. Patient asymptomatic, stable, and bleeding WNL. Orders given for Iron BID, repeat CBC and continue pitocin at 62.5 mL/ hr for 24 hours. Will continue to monitor pt and report to [facility].

## 2014-12-30 NOTE — H&P (Signed)
This is Dr. Gracy Racer dictating the history and physical on  Brittney Mcbride  she's a 34 year old gravida 2 para 101 at 26 weeks and 2 days due date 70 4 positive GBS for which she got penicillin then this pregnancy part from anemia and patient blood pressure has been creeping up and she got PIH she had a high of 450/96 in the him in the past 3 days she's been to the hospital 3 days and neuro 7 was decided that she be brought in for induction for PIH her cervix is 4 cm 50% vertex -3 and she is getting her epidural Past medical history negative Past surgical history negative Social history negative System review negative Physical exam obese female in no distress HEENT negative Lungs clear to P&A Heart regular rhythm no murmurs no gallops Breasts negative Abdomen term Pelvic as described above Extremities negative

## 2014-12-30 NOTE — Anesthesia Preprocedure Evaluation (Addendum)
Anesthesia Evaluation  Patient identified by MRN, date of birth, ID band Patient awake    Reviewed: Allergy & Precautions, H&P , NPO status , Patient's Chart, lab work & pertinent test results  History of Anesthesia Complications Negative for: history of anesthetic complications  Airway Mallampati: II  TM Distance: >3 FB Neck ROM: full    Dental no notable dental hx. (+) Teeth Intact   Pulmonary neg pulmonary ROS,    Pulmonary exam normal breath sounds clear to auscultation       Cardiovascular negative cardio ROS Normal cardiovascular exam Rhythm:regular Rate:Normal     Neuro/Psych negative neurological ROS  negative psych ROS   GI/Hepatic negative GI ROS, Neg liver ROS,   Endo/Other  Morbid obesity  Renal/GU negative Renal ROS  negative genitourinary   Musculoskeletal   Abdominal   Peds  Hematology  (+) anemia ,   Anesthesia Other Findings   Reproductive/Obstetrics (+) Pregnancy                             Anesthesia Physical Anesthesia Plan  ASA: III  Anesthesia Plan: Epidural   Post-op Pain Management:    Induction:   Airway Management Planned:   Additional Equipment:   Intra-op Plan:   Post-operative Plan:   Informed Consent: I have reviewed the patients History and Physical, chart, labs and discussed the procedure including the risks, benefits and alternatives for the proposed anesthesia with the patient or authorized representative who has indicated his/her understanding and acceptance.     Plan Discussed with:   Anesthesia Plan Comments:         Anesthesia Quick Evaluation

## 2014-12-30 NOTE — Lactation Note (Signed)
This note was copied from the chart of [place]. Lactation Consultation Note Initial visit at 18 hours of age.  Mom had a large blood loss with delivery requiring IV medication and mom complains she cant use her arm.  Baby asleep in crib and mom reports recent attempt with STS but baby has not latched well all day.  Worked with mom on hand expression and a few drops noted on right nipple with hand pump, non with hand expression.  Mom is able to return demonstration but again difficult due to IV.  Mom have very large pendulous breast with large flat nipple.  Breast tissue is compressible with large diameter  Nipples.  [facility] LC resources given and discussed.  Encouraged to feed with early cues on demand.  Early newborn behavior discussed.  Hand expression demonstrated with colostrum visible.   Plan is for mom to continue to work on hand expression and spoon feed if needed. Mom will offer STS every 2 1/2 -3 hours and with feeding cues.   If baby is not satisfied with STS and needs supplementation discussed reasons to not use a bottle nipple and encouraged syringe, cup or spoon feeding first.  Mom to call for assist as needed.    Patient Name: Brittney Mcbride [id number] Date: 12/30/2014 Reason for consult: Initial assessment   Maternal Data Has patient been taught Hand Expression?: Yes Does the patient have breastfeeding experience prior to this delivery?: Yes  Feeding Feeding Type: Breast Fed  LATCH Score/Interventions Latch: Too sleepy or reluctant, no latch achieved, no sucking elicited. Intervention(s): Skin to skin;Teach feeding cues;Waking techniques Intervention(s): Breast compression;Breast massage  Audible Swallowing: None  Type of Nipple: Flat Intervention(s): Hand pump  Comfort (Breast/Nipple): Soft / non-tender     Hold (Positioning): Assistance needed to correctly position infant at breast and maintain latch. Intervention(s): Breastfeeding basics reviewed;Support  Pillows;Position options;Skin to skin  LATCH Score: 4  Lactation Tools Discussed/Used Tools: Pump Breast pump type: Manual WIC Program: Yes Pump Review: Setup, frequency, and cleaning Initiated by:: JS Date initiated:: 12/30/14   Consult Status Consult Status: Follow-up Date: 12/31/14 Follow-up type: In-patient    Brittney Mcbride 12/30/2014, 9:55 PM

## 2014-12-30 NOTE — Anesthesia Procedure Notes (Signed)
Epidural Patient location during procedure: OB  Staffing Anesthesiologist: Montez Hageman Performed by: anesthesiologist   Preanesthetic Checklist Completed: patient identified, site marked, surgical consent, pre-op evaluation, timeout performed, IV checked, risks and benefits discussed and monitors and equipment checked  Epidural Patient position: sitting Prep: DuraPrep Patient monitoring: heart rate, continuous pulse ox and blood pressure Approach: right paramedian Location: L3-L4 Injection technique: LOR saline  Needle:  Needle type: Tuohy  Needle gauge: 17 G Needle length: 9 cm and 9 Needle insertion depth: 8 cm Catheter type: closed end flexible Catheter size: 20 Guage Catheter at skin depth: 12 cm Test dose: negative  Assessment Events: blood not aspirated, injection not painful, no injection resistance, negative IV test and no paresthesia  Additional Notes Patient identified. Risks/Benefits/Options discussed with patient including but not limited to bleeding, infection, nerve damage, paralysis, failed block, incomplete pain control, headache, blood pressure changes, nausea, vomiting, reactions to medication both or allergic, itching and postpartum back pain. Confirmed with bedside nurse the patient's most recent platelet count. Confirmed with patient that they are not currently taking any anticoagulation, have any bleeding history or any family history of bleeding disorders. Patient expressed understanding and wished to proceed. All questions were answered. Sterile technique was used throughout the entire procedure. Please see nursing notes for vital signs. Test dose was given through epidural needle and negative prior to continuing to dose epidural or start infusion. Warning signs of high block given to the patient including shortness of breath, tingling/numbness in hands, complete motor block, or any concerning symptoms with instructions to call for help. Patient was given  instructions on fall risk and not to get out of bed. All questions and concerns addressed with instructions to call with any issues.

## 2014-12-31 LAB — CBC
HEMATOCRIT: 19.3 % — AB (ref 36.0–46.0)
HEMOGLOBIN: 6.1 g/dL — AB (ref 12.0–15.0)
MCH: 22.5 pg — AB (ref 26.0–34.0)
MCHC: 31.6 g/dL (ref 30.0–36.0)
MCV: 71.2 fL — AB (ref 78.0–100.0)
Platelets: 116 10*3/uL — ABNORMAL LOW (ref 150–400)
RBC: 2.71 MIL/uL — ABNORMAL LOW (ref 3.87–5.11)
RDW: 18.3 % — AB (ref 11.5–15.5)
WBC: 13.1 10*3/uL — ABNORMAL HIGH (ref 4.0–10.5)

## 2014-12-31 LAB — RPR: RPR: NONREACTIVE

## 2014-12-31 NOTE — Lactation Note (Signed)
This note was copied from the chart of [place]. Lactation Consultation Note Baby is 46 HOL and continues to be sleepy.  He gives feeding cues when not in mom's arms but stops when she holds him.  Baby sucks on his tongue and when a gloved finger is inserted into his mouth tongue humping is noted. He sucks on gloved finger once it is over the hump.  Attempts were made to encourage feeding without success.  Plan is to wait a couple more hours to see if he wakes.  Mom has mention formula so the risks of formula were discussed.  She was informed that her decision would be respected.  Patient Name: Brittney Mcbride [id number] Date: 12/31/2014 Reason for consult: Follow-up assessment   Maternal Data Has patient been taught Hand Expression?: Yes Does the patient have breastfeeding experience prior to this delivery?: Yes  Feeding Feeding Type: Breast Fed Length of feed: 0 min  LATCH Score/Interventions Latch: Too sleepy or reluctant, no latch achieved, no sucking elicited. Intervention(s): Skin to skin Intervention(s): Breast massage  Audible Swallowing: None  Type of Nipple: Everted at rest and after stimulation Intervention(s): Hand pump  Comfort (Breast/Nipple): Soft / non-tender     Hold (Positioning): Assistance needed to correctly position infant at breast and maintain latch. Intervention(s): Breastfeeding basics reviewed;Support Pillows;Position options  LATCH Score: 5  Lactation Tools Discussed/Used Tools: Pump   Consult Status Consult Status: Follow-up Date: 01/01/15 Follow-up type: In-patient    Van Clines 12/31/2014, 11:25 AM

## 2014-12-31 NOTE — Progress Notes (Signed)
CRITICAL VALUE ALERT  Critical value received:  Hg 6.1`  Date of notification: 12-31-2014  Time of notification:  0612  Critical value read back:Yes   Nurse who received alert Marion Downer RN  MD notified (1st page): No  Time of first page:  N/A  MD notified (2nd page):No  Time of second page:N/A  Responding MD:  N/A  Time MD responded:  N/A

## 2015-01-01 ENCOUNTER — Inpatient Hospital Stay (HOSPITAL_COMMUNITY): Admit: 2015-01-01 | Payer: Medicaid Other

## 2015-01-01 LAB — CBC WITH DIFFERENTIAL/PLATELET
BASOS ABS: 0.1 10*3/uL (ref 0.0–0.1)
BASOS PCT: 1 %
Eosinophils Absolute: 0.3 10*3/uL (ref 0.0–0.7)
Eosinophils Relative: 2 %
HEMATOCRIT: 20.6 % — AB (ref 36.0–46.0)
HEMOGLOBIN: 6.3 g/dL — AB (ref 12.0–15.0)
LYMPHS PCT: 17 %
Lymphs Abs: 2.1 10*3/uL (ref 0.7–4.0)
MCH: 21.9 pg — ABNORMAL LOW (ref 26.0–34.0)
MCHC: 30.6 g/dL (ref 30.0–36.0)
MCV: 71.5 fL — ABNORMAL LOW (ref 78.0–100.0)
MONO ABS: 0.7 10*3/uL (ref 0.1–1.0)
Monocytes Relative: 6 %
NEUTROS ABS: 9.2 10*3/uL — AB (ref 1.7–7.7)
NEUTROS PCT: 74 %
Platelets: 146 10*3/uL — ABNORMAL LOW (ref 150–400)
RBC: 2.88 MIL/uL — AB (ref 3.87–5.11)
RDW: 19 % — AB (ref 11.5–15.5)
WBC: 12.4 10*3/uL — AB (ref 4.0–10.5)

## 2015-01-01 MED ORDER — IBUPROFEN 800 MG PO TABS: 800.0000 mg | ORAL_TABLET | Freq: Three times a day (TID) | ORAL | Status: DC | PRN

## 2015-01-01 MED ORDER — OXYCODONE-ACETAMINOPHEN 5-325 MG PO TABS: 2.0000 | ORAL_TABLET | ORAL | Status: DC | PRN

## 2015-01-01 MED ORDER — FERRAPLUS 90 90-1 MG PO TABS: 1.0000 | ORAL_TABLET | Freq: Every day | ORAL | Status: DC

## 2015-01-01 NOTE — Discharge Summary (Signed)
Obstetric Discharge Summary Reason for Admission: onset of labor Prenatal Procedures: ultrasound Intrapartum Procedures: spontaneous vaginal delivery Postpartum Procedures: none Complications-Operative and Postpartum: 2nd deg degree perineal laceration and anemia HEMOGLOBIN  Date Value Ref Range Status  01/01/2015 6.3* 12.0 - 15.0 g/dL Final    Comment:    REPEATED TO VERIFY CRITICAL RESULT CALLED TO, READ BACK BY AND VERIFIED WITH: EISNA,A.ON [id number] AT 1208 BY PATELS.    HCT  Date Value Ref Range Status  01/01/2015 20.6* 36.0 - 46.0 % Final    Physical Exam:  General: alert, cooperative and no distress Lochia: appropriate Uterine Fundus: firm Incision: no significant drainage DVT Evaluation: No evidence of DVT seen on physical exam. No cords or calf tenderness. Calf/Ankle edema is present.  Discharge Diagnoses: Term Pregnancy-delivered  Discharge Information: Date: 01/01/2015 Activity: pelvic rest Diet: routine Medications: PNV, Ibuprofen, Colace, Iron and Percocet Condition: stable Instructions: refer to practice specific booklet Discharge to: home   Newborn Data: Live born female  Birth Weight: 8 lb 4.4 oz (3754 g) APGAR: 8, 9  Home with mother.  Morene Crocker, CNM 01/01/2015, 12:52 PM

## 2015-01-01 NOTE — Progress Notes (Signed)
Post Partum Day #2 Subjective: up ad lib, voiding, tolerating PO and reports fatigue.  Reports bottle supplementation last night.  States they are still working on latch and that she is not getting any breast milk out when she pumps or hand expresses.    Objective: Blood pressure 143/91, pulse 74, temperature 98.2 F (36.8 C), temperature source Oral, resp. rate 18, height 5\' 5"  (1.651 m), weight 291 lb (131.997 kg), last menstrual period 03/30/2014, SpO2 100 %, unknown if currently breastfeeding.  Physical Exam:  General: alert, cooperative and no distress Lochia: appropriate Uterine Fundus: firm & at U Incision: no significant drainage DVT Evaluation: No evidence of DVT seen on physical exam. No cords or calf tenderness. Calf/Ankle edema is present.   Recent Labs  12/30/14 1140 12/31/14 0515  HGB 7.1* 6.1*  HCT 23.0* 19.3*    Assessment/Plan: Plan for discharge tomorrow, Breastfeeding and Lactation consult   LOS: 3 days   Rasheen Schewe A Jemario Poitras 01/01/2015, 10:49 AM

## 2015-01-01 NOTE — Discharge Instructions (Signed)

## 2015-01-01 NOTE — Progress Notes (Signed)
CRITICAL VALUE ALERT  Critical value received:  Hgb 6.3  Date of notification:  01/01/15  Time of notification:  1207  Critical value read back:yes  Nurse who received alert:  Lorenda Hatchet RN  MD notified (1st page):  Kandis Cocking CNM  Time of first page:  1216  Responding MD:  Kandis Cocking CNM  Time MD responded:  [phone]

## 2015-01-01 NOTE — Lactation Note (Signed)
This note was copied from the chart of [place]. Lactation Consultation Note  Mom is planning a [facility] loaner to bring her milk to volume.  Discussed pumping requirements to achieve this.  She will call LC when ready for the breast pump. Patient Name: Boy Leighana Neyman [id number] Date: 01/01/2015     Maternal Data    Feeding Feeding Type: Formula Nipple Type: Slow - flow  LATCH Score/Interventions                      Lactation Tools Discussed/Used     Consult Status      Van Clines 01/01/2015, 1:07 PM

## 2015-01-02 ENCOUNTER — Encounter: Payer: Medicaid Other | Admitting: Obstetrics

## 2015-01-02 LAB — TYPE AND SCREEN
ABO/RH(D): O POS
Antibody Screen: NEGATIVE
Unit division: 0
Unit division: 0

## 2015-01-07 ENCOUNTER — Telehealth: Payer: Self-pay | Admitting: *Deleted

## 2015-01-07 ENCOUNTER — Encounter (HOSPITAL_COMMUNITY): Payer: Self-pay | Admitting: *Deleted

## 2015-01-07 ENCOUNTER — Inpatient Hospital Stay (HOSPITAL_COMMUNITY)
Admission: AD | Admit: 2015-01-07 | Discharge: 2015-01-07 | Disposition: A | Discharge status: Home / Self Care | Payer: Medicaid Other | Source: Ambulatory Visit | Attending: Obstetrics | Admitting: Obstetrics

## 2015-01-07 DIAGNOSIS — O1494 Unspecified pre-eclampsia, complicating childbirth: Secondary | ICD-10-CM | POA: Diagnosis not present

## 2015-01-07 DIAGNOSIS — O135 Gestational [pregnancy-induced] hypertension without significant proteinuria, complicating the puerperium: Secondary | ICD-10-CM | POA: Insufficient documentation

## 2015-01-07 LAB — URINALYSIS, ROUTINE W REFLEX MICROSCOPIC
Glucose, UA: NEGATIVE mg/dL
KETONES UR: NEGATIVE mg/dL
NITRITE: NEGATIVE
PROTEIN: 100 mg/dL — AB
SPECIFIC GRAVITY, URINE: 1.01 (ref 1.005–1.030)
UROBILINOGEN UA: 1 mg/dL (ref 0.0–1.0)
pH: 7 (ref 5.0–8.0)

## 2015-01-07 LAB — COMPREHENSIVE METABOLIC PANEL
ALK PHOS: 90 U/L (ref 38–126)
ALT: 28 U/L (ref 14–54)
ANION GAP: 9 (ref 5–15)
AST: 40 U/L (ref 15–41)
Albumin: 3.5 g/dL (ref 3.5–5.0)
BUN: 7 mg/dL (ref 6–20)
CALCIUM: 8.8 mg/dL — AB (ref 8.9–10.3)
CO2: 29 mmol/L (ref 22–32)
CREATININE: 0.74 mg/dL (ref 0.44–1.00)
Chloride: 104 mmol/L (ref 101–111)
Glucose, Bld: 94 mg/dL (ref 65–99)
Potassium: 3.2 mmol/L — ABNORMAL LOW (ref 3.5–5.1)
SODIUM: 142 mmol/L (ref 135–145)
TOTAL PROTEIN: 6.5 g/dL (ref 6.5–8.1)
Total Bilirubin: 0.9 mg/dL (ref 0.3–1.2)

## 2015-01-07 LAB — CBC WITH DIFFERENTIAL/PLATELET
Basophils Absolute: 0 10*3/uL (ref 0.0–0.1)
Basophils Relative: 1 %
EOS ABS: 0.3 10*3/uL (ref 0.0–0.7)
EOS PCT: 3 %
HCT: 27.8 % — ABNORMAL LOW (ref 36.0–46.0)
HEMOGLOBIN: 8.2 g/dL — AB (ref 12.0–15.0)
LYMPHS ABS: 1.9 10*3/uL (ref 0.7–4.0)
LYMPHS PCT: 22 %
MCH: 22.3 pg — AB (ref 26.0–34.0)
MCHC: 29.5 g/dL — AB (ref 30.0–36.0)
MCV: 75.7 fL — AB (ref 78.0–100.0)
MONOS PCT: 7 %
Monocytes Absolute: 0.6 10*3/uL (ref 0.1–1.0)
Neutro Abs: 5.8 10*3/uL (ref 1.7–7.7)
Neutrophils Relative %: 68 %
PLATELETS: 245 10*3/uL (ref 150–400)
RBC: 3.67 MIL/uL — ABNORMAL LOW (ref 3.87–5.11)
RDW: 24.3 % — ABNORMAL HIGH (ref 11.5–15.5)
WBC: 8.5 10*3/uL (ref 4.0–10.5)

## 2015-01-07 LAB — URINE MICROSCOPIC-ADD ON

## 2015-01-07 MED ORDER — HYDROCHLOROTHIAZIDE 25 MG PO TABS: 25.0000 mg | ORAL_TABLET | Freq: Every day | ORAL | Status: DC

## 2015-01-07 MED ORDER — FUROSEMIDE 10 MG/ML IJ SOLN
40.0000 mg | Freq: Once | INTRAMUSCULAR | Status: AC
  Administered 2015-01-07: 40 mg via INTRAMUSCULAR
  Filled 2015-01-07: qty 4

## 2015-01-07 MED ORDER — ACETAMINOPHEN 325 MG PO TABS
650.0000 mg | ORAL_TABLET | Freq: Once | ORAL | Status: AC
  Administered 2015-01-07: 650 mg via ORAL
  Filled 2015-01-07: qty 2

## 2015-01-07 MED ORDER — AMLODIPINE BESYLATE 10 MG PO TABS: 5.0000 mg | ORAL_TABLET | Freq: Every day | ORAL | Status: DC

## 2015-01-07 MED ORDER — LABETALOL HCL 100 MG PO TABS: 200.0000 mg | ORAL_TABLET | Freq: Two times a day (BID) | ORAL | Status: DC

## 2015-01-07 NOTE — MAU Note (Addendum)
Patient presents post vaginal delivery on 12/30/14 with c/o high BP. States her BP elevated towards the end of the pregnancy and she was induced as a result. Denies pain, bleeding or discharge.

## 2015-01-07 NOTE — Discharge Instructions (Signed)

## 2015-01-07 NOTE — Telephone Encounter (Signed)
Health department nurse called to let us know that patioent BP is 150/98. She does have edema- not pitting and it is going down. No other symptoms.  2:02 Per Dr Jodi Mourning- patient needs to go to MAU- patient does not drive and will have to try to find a ride- she will call me back if she can not find a ride.

## 2015-01-07 NOTE — MAU Provider Note (Signed)
History   G2P2002 delivered on 12/30/14 by Dr. Jodi Mourning. Was seen by baby love nurse today and BP was up also edema in lower extremities. States headache  that started a little while ago. Denies blurred vision, or epigastric pain.  CSN: [id number]  Arrival date & time 01/07/15  1514   First Provider Initiated Contact with Patient 01/07/15 1536      Chief Complaint  Patient presents with  . Hypertension    HPI  Past Medical History  Diagnosis Date  . HSV-2 (herpes simplex virus 2) infection   . Allergy   . Fibroid   . Infection     UTI    Past Surgical History  Procedure Laterality Date  . Wisdom tooth extraction  2000    Family History  Problem Relation Age of Onset  . Diabetes Father   . Asthma Brother   . Diabetes Brother   . Early death Brother   . Drug abuse Maternal Aunt   . Drug abuse Paternal Uncle   . Alcohol abuse Maternal Grandmother   . Asthma Maternal Grandmother   . Alzheimer's disease Maternal Grandmother   . Alcohol abuse Maternal Grandfather   . Asthma Maternal Grandfather   . Alcohol abuse Paternal Grandmother   . Diabetes Paternal Grandmother   . Alcohol abuse Paternal Grandfather     Social History  Substance Use Topics  . Smoking status: Never Smoker   . Smokeless tobacco: Never Used  . Alcohol Use: No    OB History    Gravida Para Term Preterm AB TAB SAB Ectopic Multiple Living   2 2 2  0 0 0 0 0 0 2      Review of Systems  Constitutional: Negative.   Eyes: Negative.   Respiratory: Negative.   Cardiovascular: Negative.   Gastrointestinal: Negative.   Endocrine: Negative.   Genitourinary: Negative.   Musculoskeletal: Negative.   Skin: Negative.   Allergic/Immunologic: Negative.   Neurological: Positive for headaches.  Hematological: Negative.   Psychiatric/Behavioral: Negative.     Allergies  Latex  Home Medications  No current outpatient prescriptions on file.  BP 152/99 mmHg  Pulse 87  Temp(Src) 98.8 F (37.1 C)  (Oral)  Resp 16  Ht 5\' 5"  (1.651 m)  Wt 262 lb 2 oz (118.899 kg)  BMI 43.62 kg/m2  LMP 03/30/2014  Breastfeeding? No  Physical Exam  Constitutional: She is oriented to person, place, and time. She appears well-developed and well-nourished.  HENT:  Head: Normocephalic.  Eyes: Pupils are equal, round, and reactive to light.  Cardiovascular: Normal rate, regular rhythm, normal heart sounds and intact distal pulses.   Pulmonary/Chest: Effort normal and breath sounds normal.  Abdominal: Soft. Bowel sounds are normal.  Musculoskeletal: Normal range of motion. She exhibits edema.  4+ pitting edema  Neurological: She is alert and oriented to person, place, and time. She has normal reflexes.  Skin: Skin is dry.  Psychiatric: She has a normal mood and affect. Her behavior is normal. Judgment and thought content normal.    MAU Course  Procedures (including critical care time)  Labs Reviewed  CBC WITH DIFFERENTIAL/PLATELET  COMPREHENSIVE METABOLIC PANEL  URINALYSIS, ROUTINE W REFLEX MICROSCOPIC (NOT AT [facility])   No results found.   No diagnosis found.    MDM  GHTN. D/c hone stable

## 2015-01-14 ENCOUNTER — Ambulatory Visit (INDEPENDENT_AMBULATORY_CARE_PROVIDER_SITE_OTHER): Payer: Medicaid Other | Admitting: Obstetrics

## 2015-01-14 ENCOUNTER — Encounter: Payer: Self-pay | Admitting: Obstetrics

## 2015-01-14 DIAGNOSIS — Z3009 Encounter for other general counseling and advice on contraception: Secondary | ICD-10-CM

## 2015-01-14 DIAGNOSIS — O165 Unspecified maternal hypertension, complicating the puerperium: Secondary | ICD-10-CM

## 2015-01-14 MED ORDER — LABETALOL HCL 200 MG PO TABS: 400.0000 mg | ORAL_TABLET | Freq: Two times a day (BID) | ORAL | Status: DC

## 2015-01-14 NOTE — Progress Notes (Signed)
Subjective:     Brittney Mcbride is a 34 y.o. female who presents for a postpartum visit. She is 2 weeks postpartum following a spontaneous vaginal delivery. I have fully reviewed the prenatal and intrapartum course. The delivery was at 25 gestational weeks. Outcome: spontaneous vaginal delivery. Anesthesia: epidural. Postpartum course has been normal. Baby's course has been normal. Baby is feeding by bottle - Similac Advance. Bleeding thin lochia. Bowel function is normal. Bladder function is normal. Patient is not sexually active. Contraception method is abstinence. Postpartum depression screening: negative.  Tobacco, alcohol and substance abuse history reviewed.  Adult immunizations reviewed including TDAP, rubella and varicella.  The following portions of the patient's history were reviewed and updated as appropriate: allergies, current medications, past family history, past medical history, past social history, past surgical history and problem list.  Review of Systems A comprehensive review of systems was negative.   Objective:    BP 152/105 mmHg  Pulse 84  Temp(Src) 98.1 F (36.7 C)  Ht 5\' 4"  (1.626 m)  Wt 244 lb (110.678 kg)  BMI 41.86 kg/m2  LMP 03/30/2014  Breastfeeding? No   PE:  Deferred    100% of 10 min visit spent on counseling and coordination of care.  Assessment:    2 weeks postpartum.  Doing well.  Contraception counseling and advice .  Hypertension   Plan:    1. Contraception: Undecided 2.  Continue BP meds 3. Follow up in: 2 weeks or as needed.   Healthy lifestyle practices reviewed

## 2015-01-15 ENCOUNTER — Other Ambulatory Visit: Payer: Self-pay | Admitting: Obstetrics

## 2015-01-28 ENCOUNTER — Encounter: Payer: Self-pay | Admitting: Obstetrics

## 2015-01-28 ENCOUNTER — Ambulatory Visit (INDEPENDENT_AMBULATORY_CARE_PROVIDER_SITE_OTHER): Payer: Medicaid Other | Admitting: Obstetrics

## 2015-01-28 DIAGNOSIS — A6000 Herpesviral infection of urogenital system, unspecified: Secondary | ICD-10-CM

## 2015-01-28 DIAGNOSIS — T814XXA Infection following a procedure, initial encounter: Secondary | ICD-10-CM

## 2015-01-28 DIAGNOSIS — IMO0001 Reserved for inherently not codable concepts without codable children: Secondary | ICD-10-CM

## 2015-01-28 MED ORDER — AMOXICILLIN-POT CLAVULANATE 875-125 MG PO TABS: 1.0000 | ORAL_TABLET | Freq: Two times a day (BID) | ORAL | Status: DC

## 2015-01-28 MED ORDER — VALACYCLOVIR HCL 1 G PO TABS: 1000.0000 mg | ORAL_TABLET | Freq: Two times a day (BID) | ORAL | Status: DC

## 2015-01-28 NOTE — Progress Notes (Signed)
Subjective:     Brittney Mcbride is a 34 y.o. female who presents for a postpartum visit. She is 5 weeks postpartum following a spontaneous vaginal delivery. I have fully reviewed the prenatal and intrapartum course. The delivery was at 26 gestational weeks. Outcome: spontaneous vaginal delivery. Anesthesia: epidural. Postpartum course has been normal. Baby's course has been normal. Baby is feeding by bottle - Similac Advance. Bleeding no bleeding. Bowel function is normal. Bladder function is normal. Patient is not sexually active. Contraception method is abstinence. Postpartum depression screening: negative.  Tobacco, alcohol and substance abuse history reviewed.  Adult immunizations reviewed including TDAP, rubella and varicella.  The following portions of the patient's history were reviewed and updated as appropriate: allergies, current medications, past family history, past medical history, past social history, past surgical history and problem list.  Review of Systems A comprehensive review of systems was negative.   Objective:    BP 133/93 mmHg  Pulse 88  Temp(Src) 98.3 F (36.8 C)  Wt 245 lb (111.131 kg)  Breastfeeding? No  General:  alert and no distress   Breasts:  inspection negative, no nipple discharge or bleeding, no masses or nodularity palpable  Lungs: clear to auscultation bilaterally  Heart:  regular rate and rhythm, S1, S2 normal, no murmur, click, rub or gallop  Abdomen: normal findings: soft, non-tender   Vulva:  normal  Vagina:  episiotomy clean.  Ulcerations lateral to perineum, tender, herpetic-like appearance  Cervix:  no cervical motion tenderness  Corpus: normal size, contour, position, consistency, mobility, non-tender  Adnexa:  no mass, fullness, tenderness  Rectal Exam: Not performed.          Assessment:     Normal postpartum exam. Pap smear not done at today's visit.     Genital Herpes outbreak?  Plan:    1. Contraception: considering options 2.  Augmentin / Valtrex Rx 3. Follow up in: 4 weeks or as needed.   Healthy lifestyle practices reviewed

## 2015-02-05 ENCOUNTER — Other Ambulatory Visit: Payer: Self-pay | Admitting: Obstetrics

## 2015-02-05 DIAGNOSIS — T814XXA Infection following a procedure, initial encounter: Principal | ICD-10-CM

## 2015-02-05 DIAGNOSIS — IMO0001 Reserved for inherently not codable concepts without codable children: Secondary | ICD-10-CM

## 2015-02-05 MED ORDER — AMOXICILLIN-POT CLAVULANATE 875-125 MG PO TABS
ORAL_TABLET | ORAL | Status: DC
Start: 2015-02-05 — End: 2020-11-12

## 2015-03-07 ENCOUNTER — Encounter: Payer: Self-pay | Admitting: Obstetrics

## 2015-03-07 ENCOUNTER — Ambulatory Visit (INDEPENDENT_AMBULATORY_CARE_PROVIDER_SITE_OTHER): Payer: Medicaid Other | Admitting: Obstetrics

## 2015-03-07 DIAGNOSIS — D509 Iron deficiency anemia, unspecified: Secondary | ICD-10-CM

## 2015-03-07 DIAGNOSIS — N946 Dysmenorrhea, unspecified: Secondary | ICD-10-CM | POA: Diagnosis not present

## 2015-03-07 LAB — CBC
HCT: 33.6 % — ABNORMAL LOW (ref 36.0–46.0)
Hemoglobin: 10.1 g/dL — ABNORMAL LOW (ref 12.0–15.0)
MCH: 22.7 pg — AB (ref 26.0–34.0)
MCHC: 30.1 g/dL (ref 30.0–36.0)
MCV: 75.5 fL — AB (ref 78.0–100.0)
MPV: 9.7 fL (ref 8.6–12.4)
PLATELETS: 241 10*3/uL (ref 150–400)
RBC: 4.45 MIL/uL (ref 3.87–5.11)
RDW: 21.2 % — AB (ref 11.5–15.5)
WBC: 5.3 10*3/uL (ref 4.0–10.5)

## 2015-03-07 MED ORDER — OXYCODONE HCL 10 MG PO TABS: 10.0000 mg | ORAL_TABLET | Freq: Four times a day (QID) | ORAL | Status: DC | PRN

## 2015-03-07 NOTE — Progress Notes (Signed)
Subjective:     Brittney Mcbride is a 35 y.o. female who presents for a postpartum visit. She is 9 weeks postpartum following a spontaneous vaginal delivery. I have fully reviewed the prenatal and intrapartum course. The delivery was at 40 gestational weeks. Outcome: spontaneous vaginal delivery. Anesthesia: epidural. Postpartum course has been normal. Baby's course has been normal. Baby is feeding by bottle - Similac Advance. Bleeding moderate lochia. Bowel function is normal. Bladder function is normal. Patient is not sexually active. Contraception method is abstinence. Postpartum depression screening: negative.  Tobacco, alcohol and substance abuse history reviewed.  Adult immunizations reviewed including TDAP, rubella and varicella.  The following portions of the patient's history were reviewed and updated as appropriate: allergies, current medications, past family history, past medical history, past social history, past surgical history and problem list.  Review of Systems A comprehensive review of systems was negative.   Objective:    BP 126/90 mmHg  Pulse 80  Wt 252 lb (114.306 kg)  LMP 03/06/2015  Breastfeeding? No  General:  alert and no distress   Breasts:  inspection negative, no nipple discharge or bleeding, no masses or nodularity palpable  Lungs: clear to auscultation bilaterally  Heart:  regular rate and rhythm, S1, S2 normal, no murmur, click, rub or gallop  Abdomen: soft, non-tender; bowel sounds normal; no masses,  no organomegaly   Vulva:  normal  Vagina: normal vagina  Cervix:  no cervical motion tenderness  Corpus: normal size, contour, position, consistency, mobility, non-tender  Adnexa:  no mass, fullness, tenderness  Rectal Exam: Not performed.           Assessment:     Normal postpartum exam. Pap smear not done at today's visit.     Contraceptive counseling and advice.  Considering IUD.  Plan:    1. Contraception: undecided.  Considering Mirena IUD. 2.  Iron Rx.  Continue PNV's. 3. Follow up in: 6 weeks or as needed.   Healthy lifestyle practices reviewed

## 2015-03-07 NOTE — Addendum Note (Signed)
Addended by: Lewie Loron D on: 03/07/2015 02:05 PM   Modules accepted: Orders

## 2015-04-11 ENCOUNTER — Telehealth: Payer: Self-pay | Admitting: *Deleted

## 2015-04-11 NOTE — Telephone Encounter (Signed)
Patient discussed Mirena IUD at her last appointment but has changed her mind since her last appointment. Patient states she does not currently wish to use any birth control.

## 2016-05-15 ENCOUNTER — Ambulatory Visit: Payer: Medicaid Other | Admitting: Obstetrics

## 2016-06-04 ENCOUNTER — Ambulatory Visit (INDEPENDENT_AMBULATORY_CARE_PROVIDER_SITE_OTHER): Payer: Medicaid Other | Admitting: Obstetrics

## 2016-06-04 ENCOUNTER — Encounter: Payer: Self-pay | Admitting: Obstetrics

## 2016-06-04 ENCOUNTER — Other Ambulatory Visit (HOSPITAL_COMMUNITY)
Admission: RE | Admit: 2016-06-04 | Discharge: 2016-06-04 | Disposition: A | Discharge status: Home / Self Care | Payer: Medicaid Other | Source: Ambulatory Visit | Attending: Obstetrics | Admitting: Obstetrics

## 2016-06-04 VITALS — BP 118/90 | HR 63 | Temp 97.4°F | Wt 285.0 lb

## 2016-06-04 DIAGNOSIS — N946 Dysmenorrhea, unspecified: Secondary | ICD-10-CM

## 2016-06-04 DIAGNOSIS — N760 Acute vaginitis: Secondary | ICD-10-CM | POA: Insufficient documentation

## 2016-06-04 DIAGNOSIS — Z0000 Encounter for general adult medical examination without abnormal findings: Secondary | ICD-10-CM | POA: Insufficient documentation

## 2016-06-04 DIAGNOSIS — Z124 Encounter for screening for malignant neoplasm of cervix: Secondary | ICD-10-CM

## 2016-06-04 DIAGNOSIS — Z01419 Encounter for gynecological examination (general) (routine) without abnormal findings: Secondary | ICD-10-CM

## 2016-06-04 DIAGNOSIS — A6004 Herpesviral vulvovaginitis: Secondary | ICD-10-CM

## 2016-06-04 MED ORDER — IBUPROFEN 800 MG PO TABS: 800.0000 mg | ORAL_TABLET | Freq: Three times a day (TID) | ORAL | 99 refills | Status: DC | PRN

## 2016-06-04 MED ORDER — VALACYCLOVIR HCL 1 G PO TABS: 1000.0000 mg | ORAL_TABLET | Freq: Two times a day (BID) | ORAL | 99 refills | Status: AC

## 2016-06-04 NOTE — Progress Notes (Signed)
Subjective:        Brittney Mcbride is a 36 y.o. female here for a routine exam.  Current complaints: None.    Personal health questionnaire:  Is patient Brittney Mcbride, have a family history of breast and/or ovarian cancer: no Is there a family history of uterine cancer diagnosed at age < 63, gastrointestinal cancer, urinary tract cancer, family member who is a Field seismologist syndrome-associated carrier: no Is the patient overweight and hypertensive, family history of diabetes, personal history of gestational diabetes, preeclampsia or PCOS: no Is patient over 21, have PCOS,  family history of premature CHD under age 67, diabetes, smoke, have hypertension or peripheral artery disease:  no At any time, has a partner hit, kicked or otherwise hurt or frightened you?: no Over the past 2 weeks, have you felt down, depressed or hopeless?: no Over the past 2 weeks, have you felt little interest or pleasure in doing things?:no   Gynecologic History Patient's last menstrual period was 05/12/2016 (exact date). Contraception: none Last Pap: 2016. Results were: normal Last mammogram: n/a. Results were: n/a  Obstetric History OB History  Gravida Para Term Preterm AB Living  2 2 2  0 0 2  SAB TAB Ectopic Multiple Live Births  0 0 0 0 2    # Outcome Date GA Lbr Len/2nd Weight Sex Delivery Anes PTL Lv  2 Term 12/30/14 [redacted]w[redacted]d 03:18 / 00:24 8 lb 4.4 oz (3.754 kg) M Vag-Spont EPI  LIV  1 Term 2009    M Vag-Spont EPI N LIV      Past Medical History:  Diagnosis Date  . Allergy   . Fibroid   . HSV-2 (herpes simplex virus 2) infection   . Infection    UTI    Past Surgical History:  Procedure Laterality Date  . WISDOM TOOTH EXTRACTION  2000     Current Outpatient Prescriptions:  .  amLODipine (NORVASC) 10 MG tablet, Take 0.5 tablets (5 mg total) by mouth daily., Disp: 30 tablet, Rfl: 3 .  amoxicillin-clavulanate (AUGMENTIN) 875-125 MG tablet, 1 tablet po BID x 7 days (Patient not taking: Reported  on 03/07/2015), Disp: 14 tablet, Rfl: 0 .  docusate sodium (COLACE) 100 MG capsule, Take 1 capsule (100 mg total) by mouth at bedtime., Disp: 30 capsule, Rfl: 11 .  hydrochlorothiazide (HYDRODIURIL) 25 MG tablet, Take 1 tablet (25 mg total) by mouth daily., Disp: 30 tablet, Rfl: 3 .  ibuprofen (ADVIL,MOTRIN) 800 MG tablet, Take 1 tablet (800 mg total) by mouth every 8 (eight) hours as needed for moderate pain., Disp: 30 tablet, Rfl: prn .  Iron-Folic [id number] (FERRAPLUS 90) 90-1 MG TABS, Take 1 tablet by mouth daily., Disp: 30 tablet, Rfl: 12 .  labetalol (NORMODYNE) 200 MG tablet, Take 2 tablets (400 mg total) by mouth 2 (two) times daily., Disp: 120 tablet, Rfl: 1 .  Oxycodone HCl 10 MG TABS, Take 1 tablet (10 mg total) by mouth every 6 (six) hours as needed., Disp: 40 tablet, Rfl: 0 .  Prenatal Vit-Fe Fumarate-FA (PRENATAL MULTIVITAMIN) TABS tablet, Take 1 tablet by mouth daily at 12 noon., Disp: , Rfl:  .  valACYclovir (VALTREX) 1000 MG tablet, Take 1 tablet (1,000 mg total) by mouth 2 (two) times daily., Disp: 30 tablet, Rfl: prn Allergies  Allergen Reactions  . Latex Itching    Social History  Substance Use Topics  . Smoking status: Never Smoker  . Smokeless tobacco: Never Used  . Alcohol use No    Family History  Problem Relation Age of Onset  . Diabetes Father   . Asthma Brother   . Diabetes Brother   . Early death Brother   . Drug abuse Maternal Aunt   . Drug abuse Paternal Uncle   . Alcohol abuse Maternal Grandmother   . Asthma Maternal Grandmother   . Alzheimer's disease Maternal Grandmother   . Alcohol abuse Maternal Grandfather   . Asthma Maternal Grandfather   . Alcohol abuse Paternal Grandmother   . Diabetes Paternal Grandmother   . Alcohol abuse Paternal Grandfather       Review of Systems  Constitutional: negative for fatigue and weight loss Respiratory: negative for cough and wheezing Cardiovascular: negative for chest pain, fatigue and  palpitations Gastrointestinal: negative for abdominal pain and change in bowel habits Musculoskeletal:negative for myalgias Neurological: negative for gait problems and tremors Behavioral/Psych: negative for abusive relationship, depression Endocrine: negative for temperature intolerance    Genitourinary:negative for abnormal menstrual periods, genital lesions, hot flashes, sexual problems and vaginal discharge Integument/breast: negative for breast lump, breast tenderness, nipple discharge and skin lesion(s)    Objective:       BP 118/90   Pulse 63   Temp 97.4 F (36.3 C)   Wt 285 lb (129.3 kg)   LMP 05/12/2016 (Exact Date)   Breastfeeding? No   BMI 48.92 kg/m  General:   alert  Skin:   no rash or abnormalities  Lungs:   clear to auscultation bilaterally  Heart:   regular rate and rhythm, S1, S2 normal, no murmur, click, rub or gallop  Breasts:   normal without suspicious masses, skin or nipple changes or axillary nodes  Abdomen:  normal findings: no organomegaly, soft, non-tender and no hernia  Pelvis:  External genitalia: normal general appearance Urinary system: urethral meatus normal and bladder without fullness, nontender Vaginal: normal without tenderness, induration or masses Cervix: normal appearance Adnexa: normal bimanual exam Uterus: anteverted and non-tender, normal size   Lab Review Urine pregnancy test Labs reviewed yes Radiologic studies reviewed no  50% of 20 min visit spent on counseling and coordination of care.    Assessment:    Healthy female exam.   H/O Genital Herpes.  Stable on Valtrex prn Dysmenorrhea.  Stable with Ibuprofen monthly regimen during periods   Plan:   Valtrex Rx Ibuprofen Rx  Education reviewed: calcium supplements, depression evaluation, low fat, low cholesterol diet, safe sex/STD prevention, self breast exams and weight bearing exercise. Contraception: none. Follow up in: 1 year.   Meds ordered this encounter   Medications  . ibuprofen (ADVIL,MOTRIN) 800 MG tablet    Sig: Take 1 tablet (800 mg total) by mouth every 8 (eight) hours as needed for moderate pain.    Dispense:  30 tablet    Refill:  prn  . valACYclovir (VALTREX) 1000 MG tablet    Sig: Take 1 tablet (1,000 mg total) by mouth 2 (two) times daily.    Dispense:  30 tablet    Refill:  prn   No orders of the defined types were placed in this encounter.    Patient ID: Brittney Mcbride, female   DOB: March 03, 1980, 36 y.o.   MRN: [id number]

## 2016-06-04 NOTE — Progress Notes (Signed)
Patient presents for Annual exam. No complaints today.

## 2016-06-06 ENCOUNTER — Other Ambulatory Visit: Payer: Self-pay | Admitting: Obstetrics

## 2016-06-06 DIAGNOSIS — N760 Acute vaginitis: Principal | ICD-10-CM

## 2016-06-06 DIAGNOSIS — B9689 Other specified bacterial agents as the cause of diseases classified elsewhere: Secondary | ICD-10-CM

## 2016-06-06 MED ORDER — METRONIDAZOLE 500 MG PO TABS: 500.0000 mg | ORAL_TABLET | Freq: Two times a day (BID) | ORAL | 2 refills | Status: DC

## 2016-06-10 LAB — CYTOLOGY - PAP
DIAGNOSIS: NEGATIVE
HPV (WINDOPATH): NOT DETECTED

## 2016-06-17 ENCOUNTER — Telehealth: Payer: Self-pay

## 2016-06-17 MED ORDER — FLUCONAZOLE 150 MG PO TABS: 150.0000 mg | ORAL_TABLET | Freq: Once | ORAL | 0 refills | Status: AC

## 2016-06-17 NOTE — Telephone Encounter (Signed)
TC from pt completed Flagyl on Sunday now c/o vaginal irritation near clitoris and vaginal discharge. Informed pt ABx causes yeast infections and she may have one. Pt to try OTC Monistat. Pt said she does not want cream and requests Diflucan. Diflucan 1 tab po STAT sent to the pharmacy. Pt aware if sx's worsen or does not subside by next week, to contact the office. Pt agrees and has no further questions.

## 2016-06-17 NOTE — Telephone Encounter (Signed)
Received VM from pt c/o vaginal irritation after Flagyl use. LVM for pt to c/b

## 2016-06-25 LAB — CERVICOVAGINAL ANCILLARY ONLY
Bacterial vaginitis: POSITIVE — AB
CHLAMYDIA, DNA PROBE: NEGATIVE
Candida vaginitis: NEGATIVE
Neisseria Gonorrhea: NEGATIVE
Trichomonas: NEGATIVE

## 2017-01-04 ENCOUNTER — Encounter: Payer: Self-pay | Admitting: Obstetrics

## 2017-01-04 ENCOUNTER — Other Ambulatory Visit (HOSPITAL_COMMUNITY)
Admission: RE | Admit: 2017-01-04 | Discharge: 2017-01-04 | Disposition: A | Discharge status: Home / Self Care | Payer: Medicaid Other | Source: Ambulatory Visit | Attending: Obstetrics | Admitting: Obstetrics

## 2017-01-04 ENCOUNTER — Ambulatory Visit: Payer: Medicaid Other | Admitting: Obstetrics

## 2017-01-04 VITALS — BP 125/80 | HR 88 | Wt 261.0 lb

## 2017-01-04 DIAGNOSIS — N898 Other specified noninflammatory disorders of vagina: Secondary | ICD-10-CM | POA: Insufficient documentation

## 2017-01-04 DIAGNOSIS — R102 Pelvic and perineal pain: Secondary | ICD-10-CM

## 2017-01-04 DIAGNOSIS — N760 Acute vaginitis: Secondary | ICD-10-CM | POA: Diagnosis not present

## 2017-01-04 DIAGNOSIS — B9689 Other specified bacterial agents as the cause of diseases classified elsewhere: Secondary | ICD-10-CM | POA: Insufficient documentation

## 2017-01-04 MED ORDER — SECNIDAZOLE 2 G PO PACK: 1.0000 | PACK | Freq: Once | ORAL | 2 refills | Status: AC

## 2017-01-04 NOTE — Progress Notes (Signed)
Patient ID: Brittney Mcbride, female   DOB: December 20, 1980, 36 y.o.   MRN: [id number]  Chief Complaint  Patient presents with  . Gynecologic Exam    Vaginal discharge and lower abdominal pain    HPI Brittney Mcbride is a 36 y.o. female.  Vaginal discharge with slight odor and left sided pelvic pain. HPI  Past Medical History:  Diagnosis Date  . Allergy   . Fibroid   . HSV-2 (herpes simplex virus 2) infection   . Infection    UTI    Past Surgical History:  Procedure Laterality Date  . WISDOM TOOTH EXTRACTION  2000    Family History  Problem Relation Age of Onset  . Diabetes Father   . Asthma Brother   . Diabetes Brother   . Early death Brother   . Drug abuse Maternal Aunt   . Drug abuse Paternal Uncle   . Alcohol abuse Maternal Grandmother   . Asthma Maternal Grandmother   . Alzheimer's disease Maternal Grandmother   . Alcohol abuse Maternal Grandfather   . Asthma Maternal Grandfather   . Alcohol abuse Paternal Grandmother   . Diabetes Paternal Grandmother   . Alcohol abuse Paternal Grandfather     Social History Social History   Tobacco Use  . Smoking status: Never Smoker  . Smokeless tobacco: Never Used  Substance Use Topics  . Alcohol use: No    Alcohol/week: 0.0 oz  . Drug use: No    Allergies  Allergen Reactions  . Latex Itching    Current Outpatient Medications  Medication Sig Dispense Refill  . ibuprofen (ADVIL,MOTRIN) 800 MG tablet Take 1 tablet (800 mg total) by mouth every 8 (eight) hours as needed for moderate pain. 30 tablet prn  . valACYclovir (VALTREX) 1000 MG tablet Take 1 tablet (1,000 mg total) by mouth 2 (two) times daily. 30 tablet prn  . amLODipine (NORVASC) 10 MG tablet Take 0.5 tablets (5 mg total) by mouth daily. (Patient not taking: Reported on 01/04/2017) 30 tablet 3  . amoxicillin-clavulanate (AUGMENTIN) 875-125 MG tablet 1 tablet po BID x 7 days (Patient not taking: Reported on 03/07/2015) 14 tablet 0  . docusate sodium (COLACE) 100 MG  capsule Take 1 capsule (100 mg total) by mouth at bedtime. (Patient not taking: Reported on 01/04/2017) 30 capsule 11  . hydrochlorothiazide (HYDRODIURIL) 25 MG tablet Take 1 tablet (25 mg total) by mouth daily. (Patient not taking: Reported on 01/04/2017) 30 tablet 3  . Iron-Folic [id number] (FERRAPLUS 90) 90-1 MG TABS Take 1 tablet by mouth daily. (Patient not taking: Reported on 01/04/2017) 30 tablet 12  . labetalol (NORMODYNE) 200 MG tablet Take 2 tablets (400 mg total) by mouth 2 (two) times daily. (Patient not taking: Reported on 01/04/2017) 120 tablet 1  . metroNIDAZOLE (FLAGYL) 500 MG tablet Take 1 tablet (500 mg total) by mouth 2 (two) times daily. (Patient not taking: Reported on 01/04/2017) 14 tablet 2  . Oxycodone HCl 10 MG TABS Take 1 tablet (10 mg total) by mouth every 6 (six) hours as needed. (Patient not taking: Reported on 01/04/2017) 40 tablet 0  . Prenatal Vit-Fe Fumarate-FA (PRENATAL MULTIVITAMIN) TABS tablet Take 1 tablet by mouth daily at 12 noon.    . Secnidazole (SOLOSEC) 2 g PACK Take 1 packet once for 1 dose by mouth. Mix as directed with yogurt, pudding, applesauce, etc. 1 each 2   No current facility-administered medications for this visit.     Review of Systems Review of Systems Constitutional: negative  for fatigue and weight loss Respiratory: negative for cough and wheezing Cardiovascular: negative for chest pain, fatigue and palpitations Gastrointestinal: negative for abdominal pain and change in bowel habits Genitourinary:negative Integument/breast: negative for nipple discharge Musculoskeletal:negative for myalgias Neurological: negative for gait problems and tremors Behavioral/Psych: negative for abusive relationship, depression Endocrine: negative for temperature intolerance      Blood pressure 125/80, pulse 88, weight 261 lb (118.4 kg), last menstrual period 12/12/2016.  Physical Exam Physical Exam General:   alert  Skin:   no rash or abnormalities   Lungs:   clear to auscultation bilaterally  Heart:   regular rate and rhythm, S1, S2 normal, no murmur, click, rub or gallop  Breasts:   normal without suspicious masses, skin or nipple changes or axillary nodes  Abdomen:  normal findings: no organomegaly, soft, non-tender and no hernia  Pelvis:  External genitalia: normal general appearance Urinary system: urethral meatus normal and bladder without fullness, nontender Vaginal: normal without tenderness, induration or masses Cervix: normal appearance Adnexa: tenderness LLQ.  No masses. Uterus: anteverted and non-tender, normal size    50% of 15 min visit spent on counseling and coordination of care.    Data Reviewed Wet Prep Urinalysis  Assessment     1. Vaginal discharge Rx: - Cervicovaginal ancillary only - Secnidazole (SOLOSEC) 2 g PACK; Take 1 packet once for 1 dose by mouth. Mix as directed with yogurt, pudding, applesauce, etc.  Dispense: 1 each; Refill: 2  2. Pelvic pain in female - ultrasound ordered - Ibuprofen prn   Plan    Follow up in 2 weeks  Orders Placed This Encounter  Procedures  . US PELVIC COMPLETE WITH TRANSVAGINAL    Standing Status:   Future    Standing Expiration Date:   03/06/2018    Order Specific Question:   Reason for Exam (SYMPTOM  OR DIAGNOSIS REQUIRED)    Answer:   Pelvic pain    Order Specific Question:   Preferred imaging location?    Answer:   [facility]   Meds ordered this encounter  Medications  . Secnidazole (SOLOSEC) 2 g PACK    Sig: Take 1 packet once for 1 dose by mouth. Mix as directed with yogurt, pudding, applesauce, etc.    Dispense:  1 each    Refill:  2

## 2017-01-04 NOTE — Progress Notes (Signed)
Presents for lower abdominal pain 6/10 and yellow discharge x 14 days with slight odor. Denies itching, burning, NV.

## 2017-01-05 LAB — CERVICOVAGINAL ANCILLARY ONLY
BACTERIAL VAGINITIS: POSITIVE — AB
CANDIDA VAGINITIS: NEGATIVE
CHLAMYDIA, DNA PROBE: NEGATIVE
NEISSERIA GONORRHEA: NEGATIVE
Trichomonas: NEGATIVE

## 2017-01-06 ENCOUNTER — Other Ambulatory Visit: Payer: Self-pay | Admitting: Obstetrics

## 2017-01-06 DIAGNOSIS — B9689 Other specified bacterial agents as the cause of diseases classified elsewhere: Secondary | ICD-10-CM

## 2017-01-06 DIAGNOSIS — N760 Acute vaginitis: Principal | ICD-10-CM

## 2017-01-06 MED ORDER — SECNIDAZOLE 2 G PO PACK: 1.0000 | PACK | Freq: Once | ORAL | 2 refills | Status: AC

## 2017-01-13 ENCOUNTER — Ambulatory Visit (HOSPITAL_COMMUNITY): Payer: Medicaid Other

## 2017-01-15 ENCOUNTER — Other Ambulatory Visit (HOSPITAL_COMMUNITY): Payer: Medicaid Other

## 2017-01-18 ENCOUNTER — Encounter (HOSPITAL_COMMUNITY): Payer: Self-pay

## 2017-01-18 ENCOUNTER — Ambulatory Visit (HOSPITAL_COMMUNITY)
Admission: RE | Admit: 2017-01-18 | Discharge: 2017-01-18 | Disposition: A | Discharge status: Home / Self Care | Payer: Medicaid Other | Source: Ambulatory Visit | Attending: Obstetrics | Admitting: Obstetrics

## 2017-01-18 DIAGNOSIS — D259 Leiomyoma of uterus, unspecified: Secondary | ICD-10-CM | POA: Diagnosis not present

## 2017-01-18 DIAGNOSIS — R102 Pelvic and perineal pain: Secondary | ICD-10-CM | POA: Diagnosis not present

## 2017-04-12 IMAGING — US US MFM OB FOLLOW-UP
1 series · 12 of 28 positions shown · non-contrast
Comparison: none

[Series 1: us mfm ob follow-up · 0.28mm/px · 38 acquisitions, 12 frames shown]
[im 2/38]
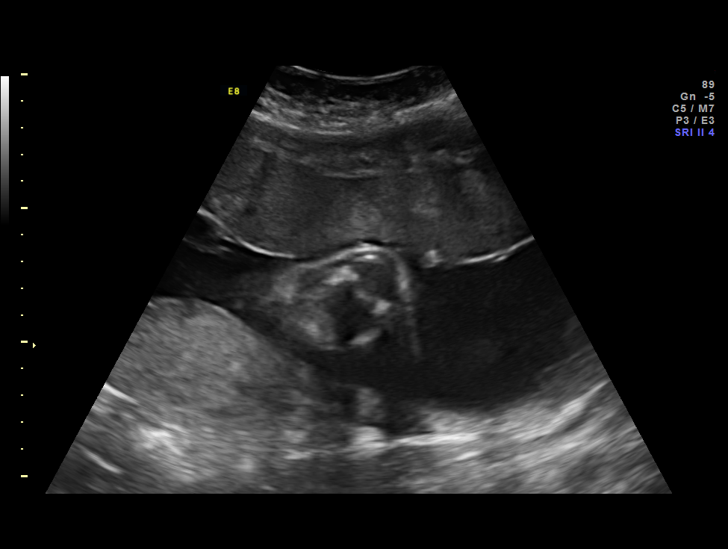
[im 5/38]
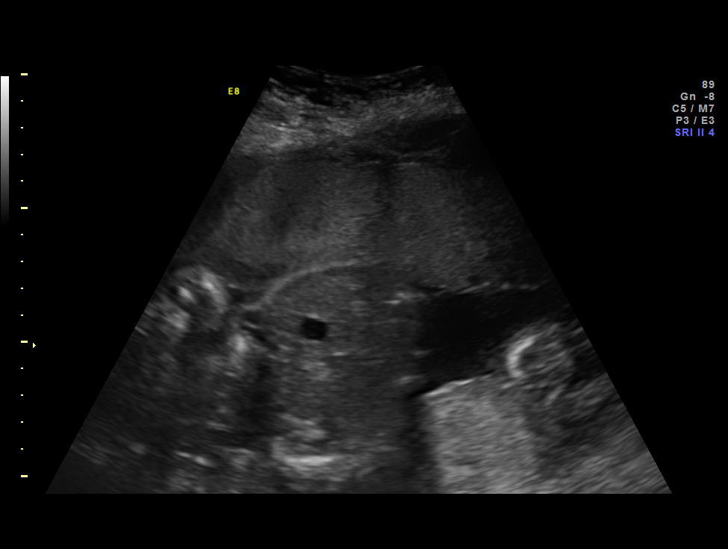
[im 7/38]
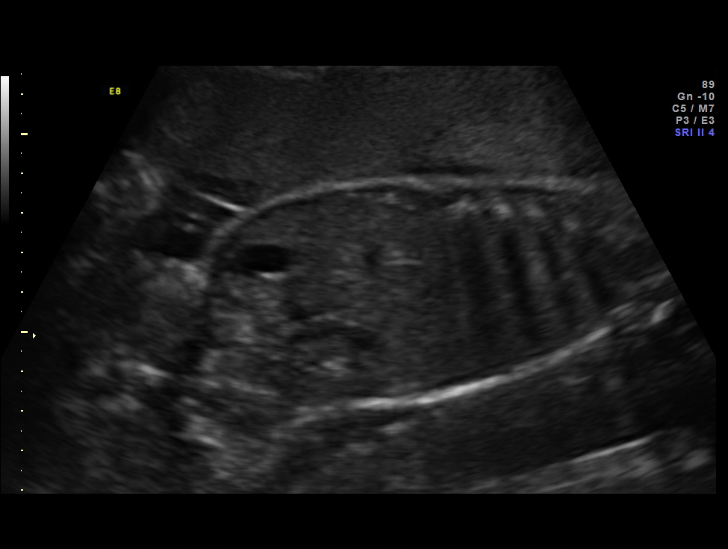
[im 11/38]
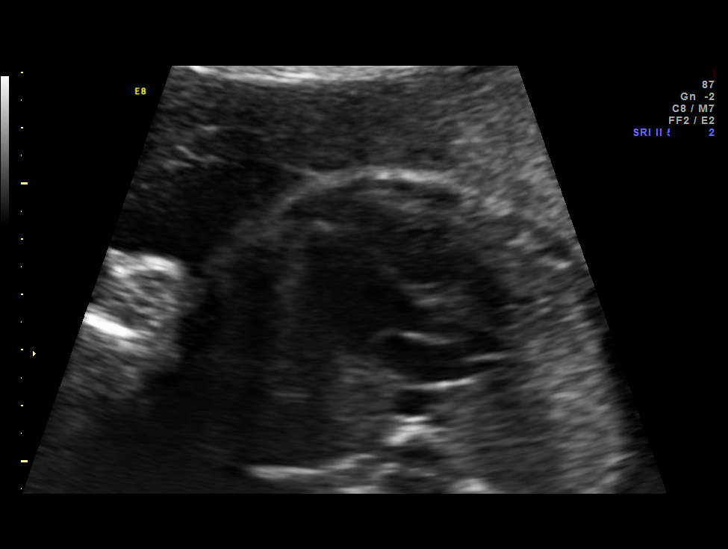
[im 14/38]
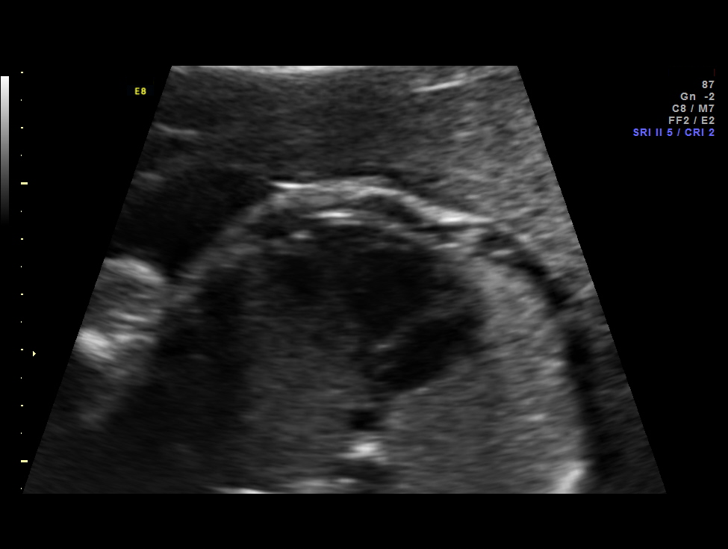
[im 17/38]
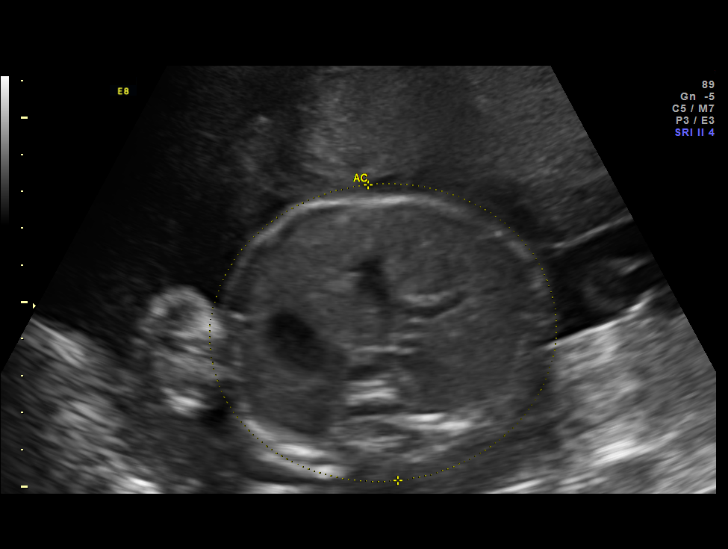
[im 21/38]
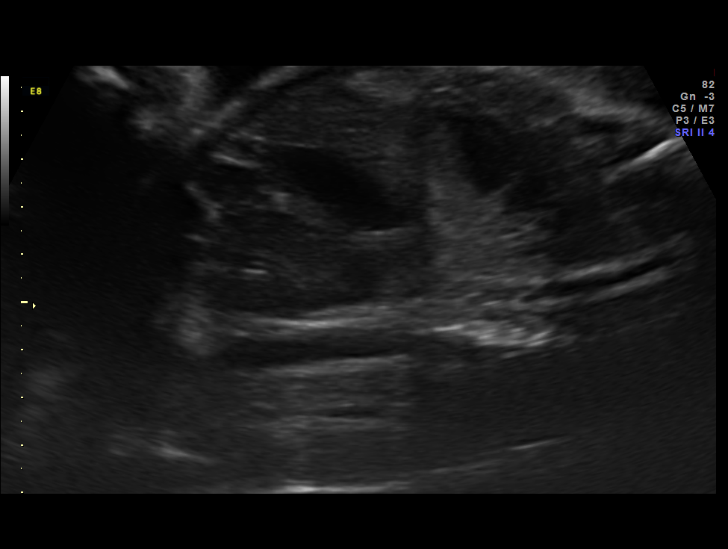
[im 24/38]
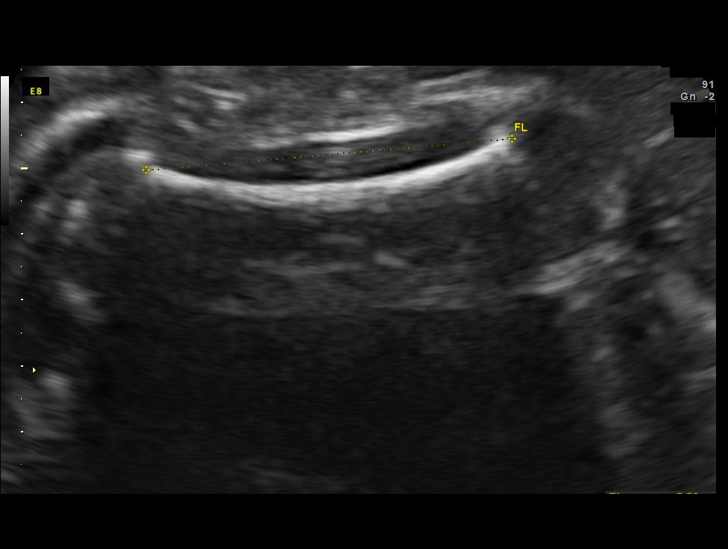
[im 27/38]
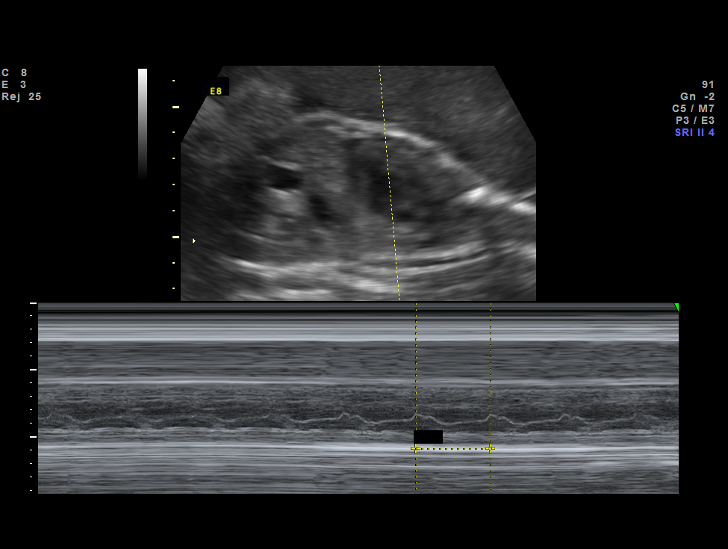
[im 31/38]
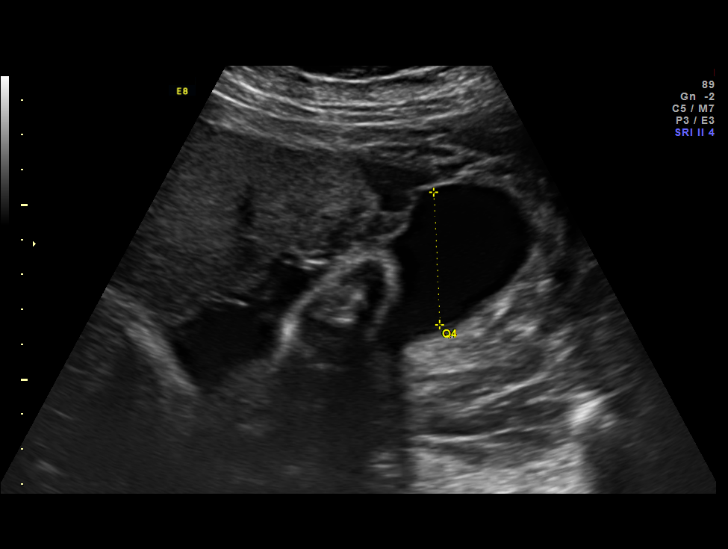
[im 33/38]
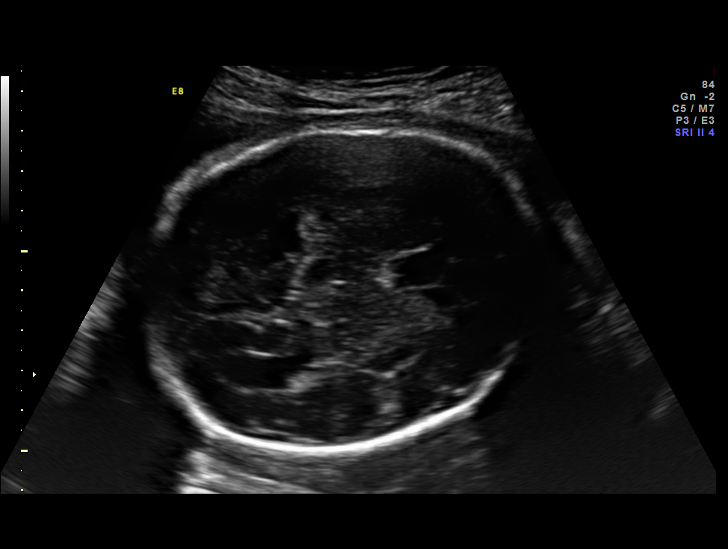
[im 36/38]
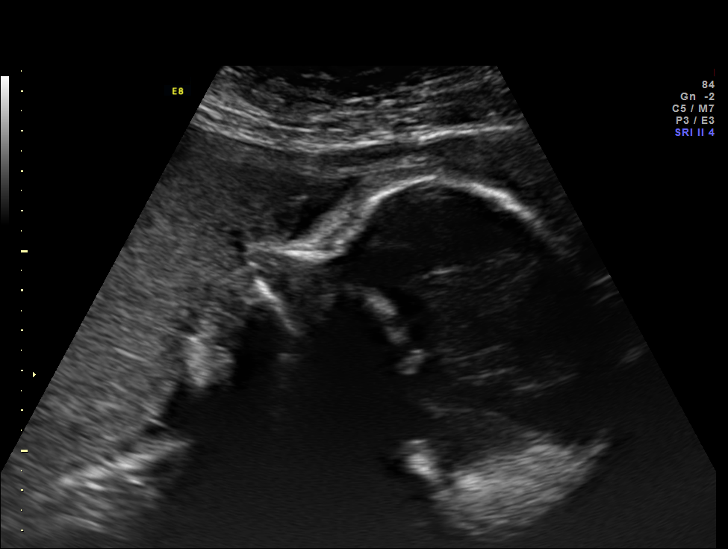

[12 of 28 positions shown; findings below may reference images not displayed]

OBSTETRICS REPORT
(Signed Final 11/02/2014 [DATE])

Service(s) Provided

Indications

Large for gestational age fetus affecting
management of mother
31 weeks gestation of pregnancy
Abnormal biochemical screen (quad) for Trisomy
21 (1in 177)
Uterine fibroids
Obesity complicating pregnancy
Fetal Evaluation

Num Of Fetuses:    1
Fetal Heart Rate:  135                          bpm
Cardiac Activity:  Observed
Presentation:      Cephalic
Placenta:          Right lateral, above
cervical os
P. Cord            Previously Visualized
Insertion:

Amniotic Fluid
AFI FV:      Subjectively within normal limits
AFI Sum:     15.72   cm       56  %Tile     Larg Pckt:    6.05  cm
RUQ:   3.95    cm   RLQ:    3.8    cm    LUQ:   1.92    cm   LLQ:    6.05   cm
Biometry

BPD:     78.2  mm     G. Age:  31w 3d                CI:         75.0   70 - 86
OFD:    104.2  mm                                    FL/HC:      19.4   19.3 -
21.3
HC:     294.6  mm     G. Age:  32w 4d       56  %    HC/AC:      1.07   0.96 -
1.17
AC:     274.6  mm     G. Age:  31w 4d       62  %    FL/BPD:     73.1   71 - 87
FL:      57.2  mm     G. Age:  30w 0d       13  %    FL/AC:      20.8   20 - 24
HUM:     59.1  mm     G. Age:  34w 1d     > 95  %

Est. FW:    7568  gm    3 lb 12 oz      58  %
Gestational Age

LMP:           31w 0d        Date:  03/30/14                 EDD:   01/04/15
U/S Today:     31w 3d                                        EDD:   01/01/15
Best:          31w 0d     Det. By:  LMP  (03/30/14)          EDD:   01/04/15
Anatomy

Cranium:          Appears normal         Aortic Arch:      Previously seen
Fetal Cavum:      Appears normal         Ductal Arch:      Previously seen
Ventricles:       Appears normal         Diaphragm:        Appears normal
Choroid Plexus:   Previously seen        Stomach:          Appears normal, left
sided
Cerebellum:       Previously seen        Abdomen:          Previously seen
Posterior Fossa:  Previously seen        Abdominal Wall:   Previously seen
Nuchal Fold:      Previously seen        Cord Vessels:     Previously seen
Face:             Orbits and profile     Kidneys:          Appear normal
previously seen
Lips:             Previously seen        Bladder:          Appears normal
Palate:           Previously seen        Spine:            Previously seen
Heart:            Appears normal         Lower             Previously seen
(4CH, axis, and        Extremities:
situs)
RVOT:             Appears normal         Upper             Previously seen
Extremities:
LVOT:             Appears normal

Other:  Male gender. Heels and  Rt 5th digit previously seen.
Cervix Uterus Adnexa

Cervix:       Not visualized (advanced GA >30wks)
Impression

Singleton intrauterine pregnancy at 31 weeks 0 days
gestation with fetal cardiac activity
Cephalic presentation
Right lateral placenta without evidence of previa
Normal appearing fetal growth and amniotic fluid volume
Completion of fetal anatomic survey
Recommendations

Follow-up ultrasounds as clinically indicated.

questions or concerns.

## 2017-06-12 ENCOUNTER — Other Ambulatory Visit: Payer: Self-pay | Admitting: Obstetrics

## 2017-06-12 DIAGNOSIS — N946 Dysmenorrhea, unspecified: Secondary | ICD-10-CM

## 2019-06-29 IMAGING — US US PELVIS COMPLETE TRANSABD/TRANSVAG
1 series · 15 of 25 positions shown · non-contrast
Comparison: None

CLINICAL DATA: Pelvic pain for 3 weeks



[Series 1: us pelvis complete transabd/transvag · 15 of 42 slices shown]
[im 1/42]
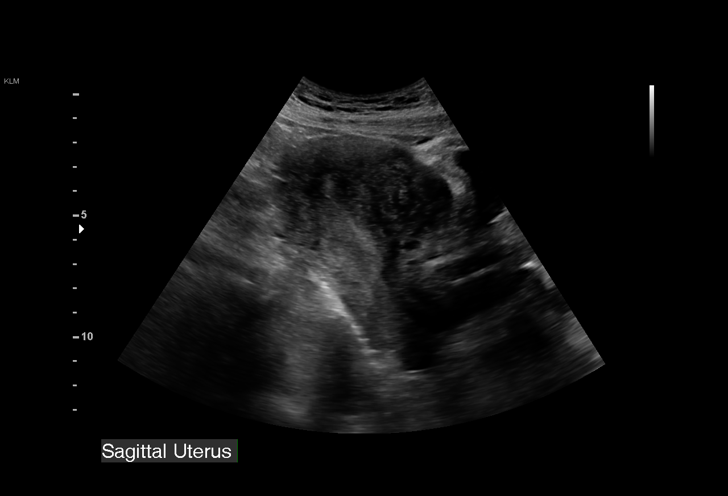
[im 4/42]
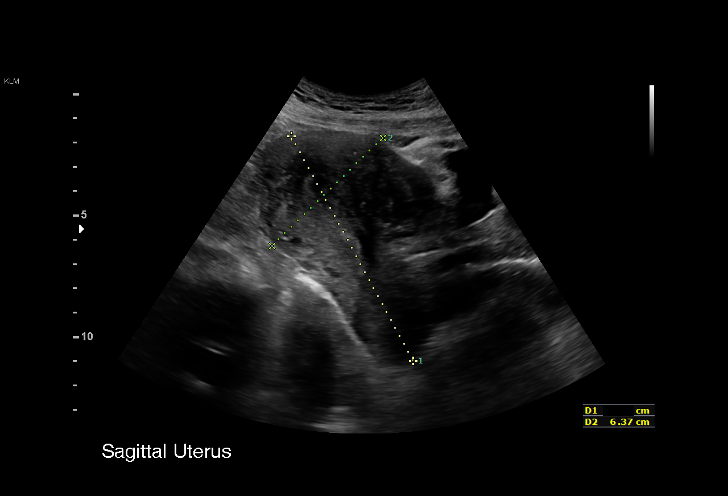
[im 7/42]
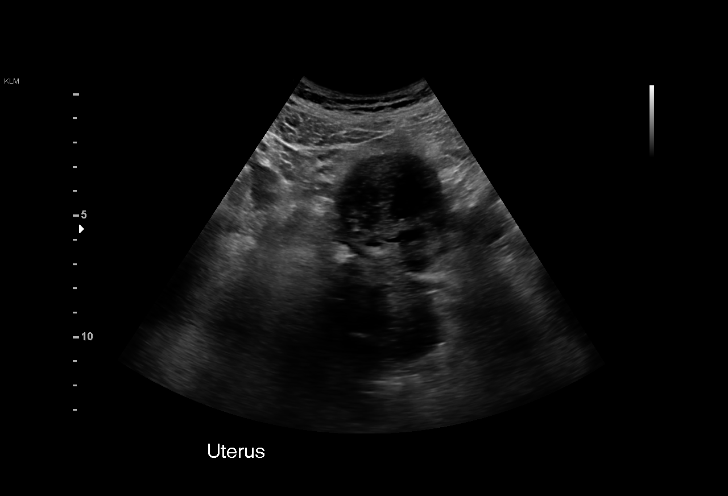
[im 9/42]
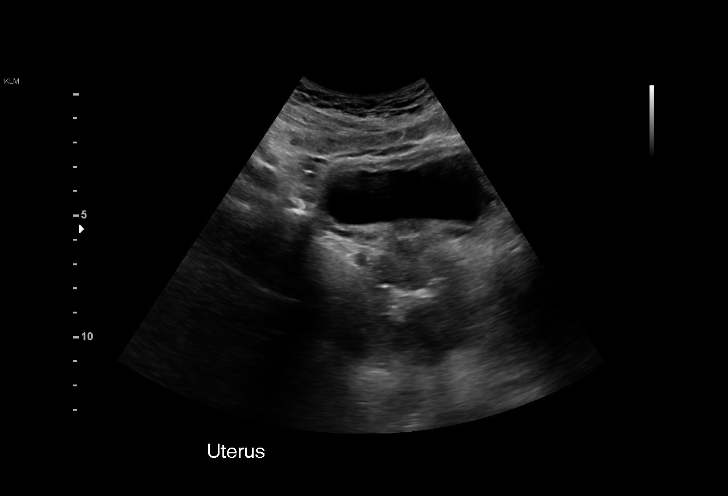
[im 12/42]
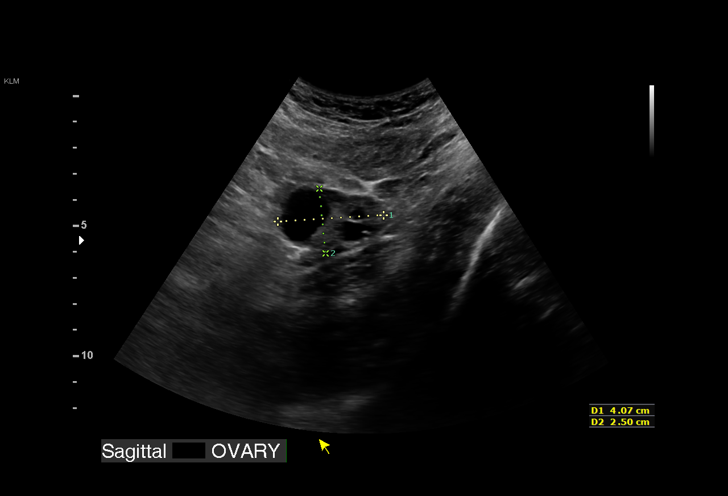
[im 16/42]
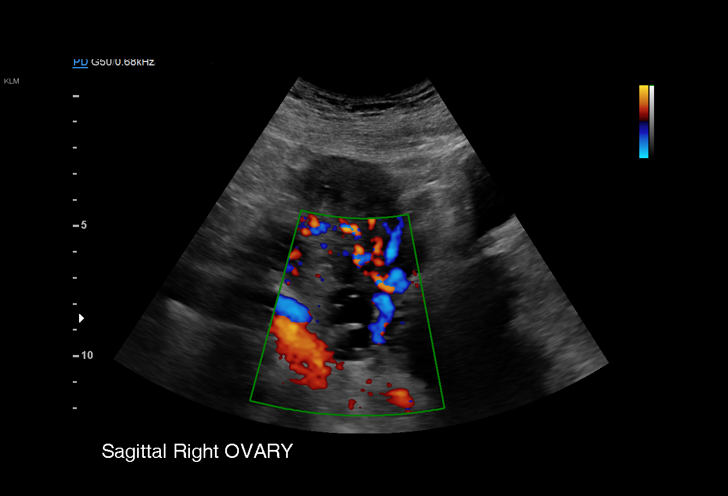
[im 18/42]
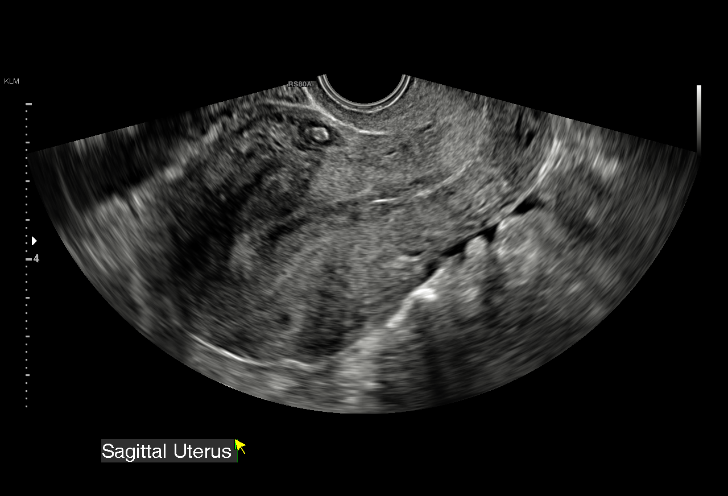
[im 21/42]
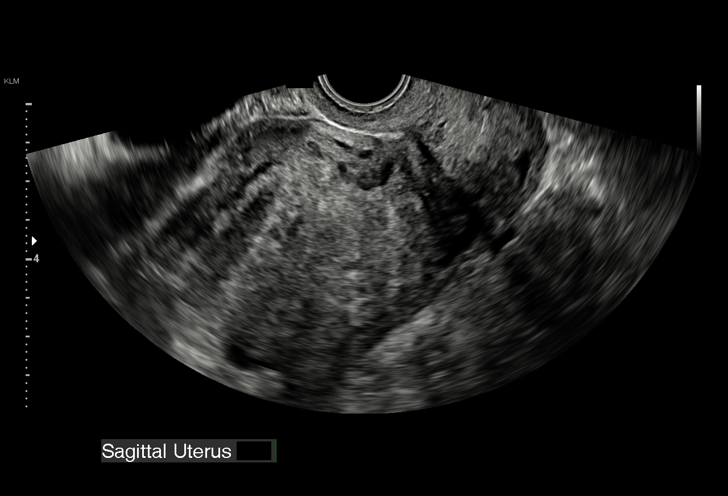
[im 24/42]
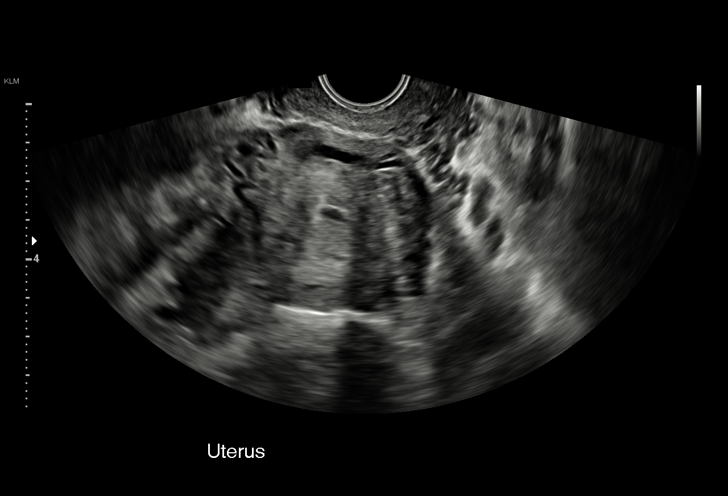
[im 26/42]
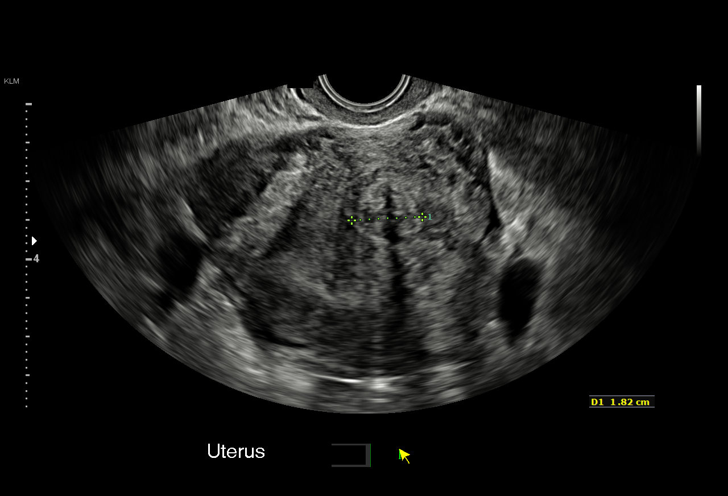
[im 30/42]
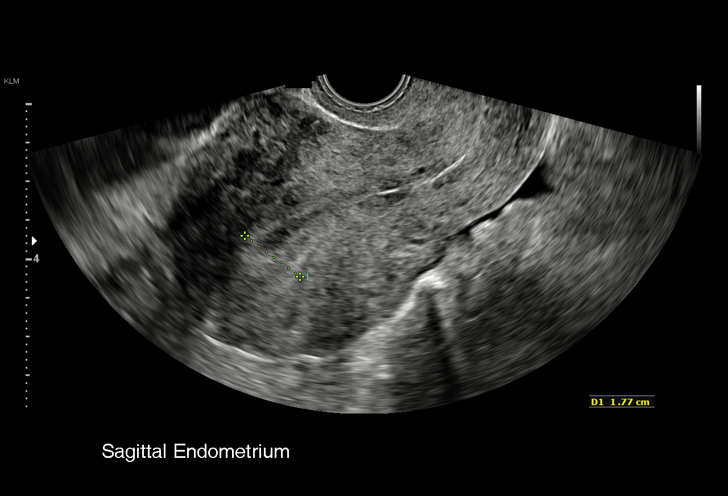
[im 33/42]
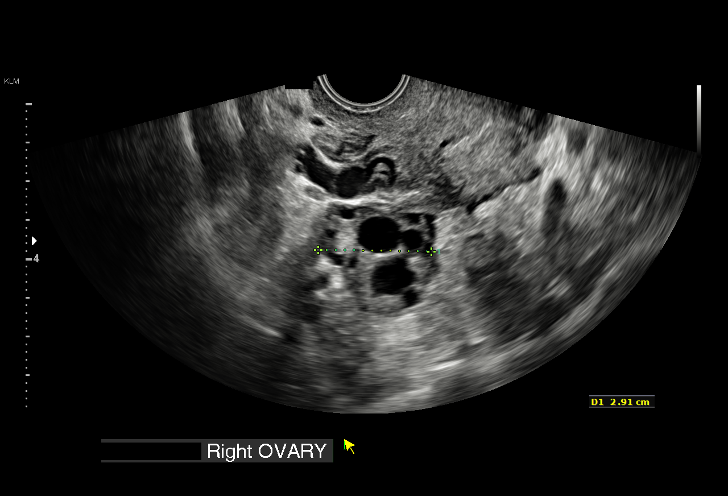
[im 35/42]
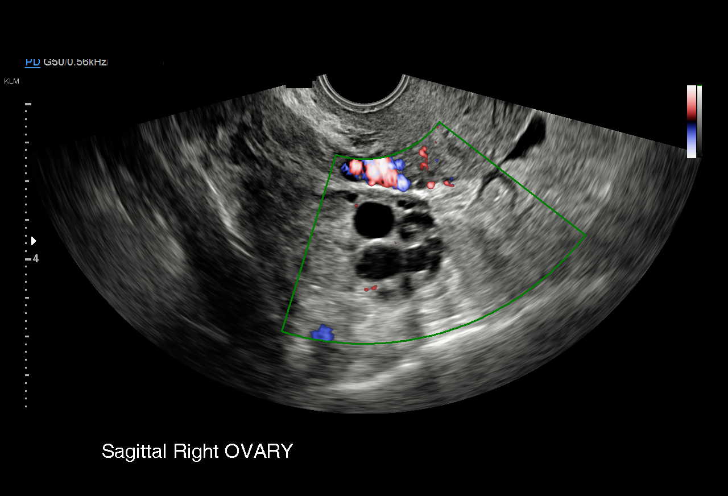
[im 38/42]
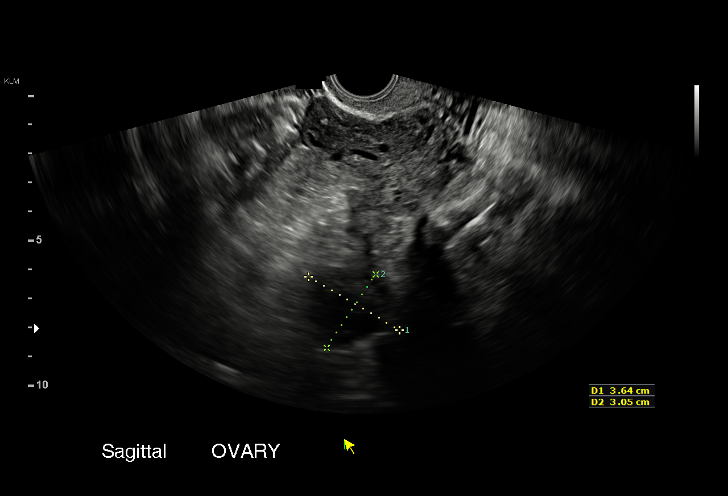
[im 42/42]
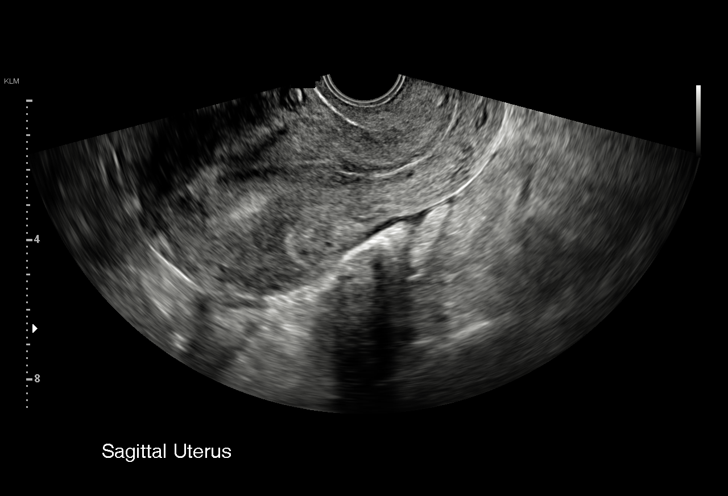

[15 of 25 positions shown; findings below may reference images not displayed]

FINDINGS: Uterus

Measurements: 10.5 x 6.4 x 7.6 cm. Small left fundal fibroid
measures up to 2.5 cm.

Endometrium

Thickness: 18 mm in thickness.  No focal abnormality visualized.

Right ovary

Measurements: 3.5 x 2.5 x 2.9 cm. Normal appearance/no adnexal mass.

Left ovary

Measurements: 4.1 x 2.5 x 3.8 cm. Normal appearance/no adnexal mass.

Other findings

No free fluid
IMPRESSION: Small left fundal fibroid, 2.5 cm.

Endometrium upper limits normal in thickness at 18 mm.

## 2020-11-11 ENCOUNTER — Ambulatory Visit (INDEPENDENT_AMBULATORY_CARE_PROVIDER_SITE_OTHER): Payer: Medicaid Other | Admitting: Obstetrics

## 2020-11-11 ENCOUNTER — Encounter: Payer: Self-pay | Admitting: Obstetrics

## 2020-11-11 ENCOUNTER — Other Ambulatory Visit (HOSPITAL_COMMUNITY)
Admission: RE | Admit: 2020-11-11 | Discharge: 2020-11-11 | Disposition: A | Discharge status: Home / Self Care | Payer: Medicaid Other | Source: Ambulatory Visit | Attending: Obstetrics | Admitting: Obstetrics

## 2020-11-11 ENCOUNTER — Other Ambulatory Visit: Payer: Self-pay

## 2020-11-11 VITALS — BP 124/87 | HR 82 | Wt 294.4 lb

## 2020-11-11 DIAGNOSIS — Z01419 Encounter for gynecological examination (general) (routine) without abnormal findings: Secondary | ICD-10-CM | POA: Diagnosis not present

## 2020-11-11 DIAGNOSIS — Z113 Encounter for screening for infections with a predominantly sexual mode of transmission: Secondary | ICD-10-CM

## 2020-11-11 DIAGNOSIS — Z0000 Encounter for general adult medical examination without abnormal findings: Secondary | ICD-10-CM

## 2020-11-11 DIAGNOSIS — N898 Other specified noninflammatory disorders of vagina: Secondary | ICD-10-CM | POA: Diagnosis not present

## 2020-11-11 DIAGNOSIS — D251 Intramural leiomyoma of uterus: Secondary | ICD-10-CM

## 2020-11-11 DIAGNOSIS — Z6841 Body Mass Index (BMI) 40.0 and over, adult: Secondary | ICD-10-CM

## 2020-11-11 DIAGNOSIS — Z3202 Encounter for pregnancy test, result negative: Secondary | ICD-10-CM

## 2020-11-11 LAB — POCT URINE PREGNANCY: Preg Test, Ur: NEGATIVE

## 2020-11-11 NOTE — Progress Notes (Signed)
Subjective:        Brittney Mcbride is a 40 y.o. female here for a routine exam.  Current complaints:.  Vaginal discharge, slight odor.  Personal health questionnaire:  Is patient Ashkenazi Jewish, have a family history of breast and/or ovarian cancer: no Is there a family history of uterine cancer diagnosed at age < 85, gastrointestinal cancer, urinary tract cancer, family member who is a Field seismologist syndrome-associated carrier: no Is the patient overweight and hypertensive, family history of diabetes, personal history of gestational diabetes, preeclampsia or PCOS: yes Is patient over 21, have PCOS,  family history of premature CHD under age 11, diabetes, smoke, have hypertension or peripheral artery disease:  no At any time, has a partner hit, kicked or otherwise hurt or frightened you?: no Over the past 2 weeks, have you felt down, depressed or hopeless?: no Over the past 2 weeks, have you felt little interest or pleasure in doing things?:no   Gynecologic History Patient's last menstrual period was 11/02/2020. Contraception: abstinence Last Pap: 2018. Results were: normal Last mammogram: n/a. Results were: n/a  Obstetric History OB History  Gravida Para Term Preterm AB Living  '2 2 2 '$ 0 0 2  SAB IAB Ectopic Multiple Live Births  0 0 0 0 2    # Outcome Date GA Lbr Len/2nd Weight Sex Delivery Anes PTL Lv  2 Term 12/30/14 73w2d03:18 / 00:24 8 lb 4.4 oz (3.754 kg) M Vag-Spont EPI  LIV  1 Term 2009    M Vag-Spont EPI N LIV    Past Medical History:  Diagnosis Date   Allergy    Fibroid    HSV-2 (herpes simplex virus 2) infection    Infection    UTI    Past Surgical History:  Procedure Laterality Date   WISDOM TOOTH EXTRACTION  2000     Current Outpatient Medications:    amLODipine (NORVASC) 10 MG tablet, Take 0.5 tablets (5 mg total) by mouth daily. (Patient not taking: Reported on 01/04/2017), Disp: 30 tablet, Rfl: 3   amoxicillin-clavulanate (AUGMENTIN) 875-125 MG tablet, 1  tablet po BID x 7 days (Patient not taking: Reported on 03/07/2015), Disp: 14 tablet, Rfl: 0   docusate sodium (COLACE) 100 MG capsule, Take 1 capsule (100 mg total) by mouth at bedtime. (Patient not taking: Reported on 01/04/2017), Disp: 30 capsule, Rfl: 11   hydrochlorothiazide (HYDRODIURIL) 25 MG tablet, Take 1 tablet (25 mg total) by mouth daily. (Patient not taking: Reported on 01/04/2017), Disp: 30 tablet, Rfl: 3   ibuprofen (ADVIL,MOTRIN) 800 MG tablet, TAKE 1 TABLET BY MOUTH EVERY 8 HOURS AS NEEDED FOR  MODERATE  PAIN, Disp: 30 tablet, Rfl: 0   Iron-Folic [id number](FERRAPLUS 90) 90-1 MG TABS, Take 1 tablet by mouth daily. (Patient not taking: Reported on 01/04/2017), Disp: 30 tablet, Rfl: 12   labetalol (NORMODYNE) 200 MG tablet, Take 2 tablets (400 mg total) by mouth 2 (two) times daily. (Patient not taking: Reported on 01/04/2017), Disp: 120 tablet, Rfl: 1   metroNIDAZOLE (FLAGYL) 500 MG tablet, Take 1 tablet (500 mg total) by mouth 2 (two) times daily. (Patient not taking: Reported on 01/04/2017), Disp: 14 tablet, Rfl: 2   Oxycodone HCl 10 MG TABS, Take 1 tablet (10 mg total) by mouth every 6 (six) hours as needed. (Patient not taking: Reported on 01/04/2017), Disp: 40 tablet, Rfl: 0   Prenatal Vit-Fe Fumarate-FA (PRENATAL MULTIVITAMIN) TABS tablet, Take 1 tablet by mouth daily at 12 noon., Disp: , Rfl:  valACYclovir (VALTREX) 1000 MG tablet, Take 1 tablet (1,000 mg total) by mouth 2 (two) times daily., Disp: 30 tablet, Rfl: prn Allergies  Allergen Reactions   Latex Itching    Social History   Tobacco Use   Smoking status: Never   Smokeless tobacco: Never  Substance Use Topics   Alcohol use: No    Alcohol/week: 0.0 standard drinks    Family History  Problem Relation Age of Onset   Diabetes Father    Asthma Brother    Diabetes Brother    Early death Brother    Drug abuse Maternal Aunt    Drug abuse Paternal Uncle    Alcohol abuse Maternal Grandmother    Asthma Maternal  Grandmother    Alzheimer's disease Maternal Grandmother    Alcohol abuse Maternal Grandfather    Asthma Maternal Grandfather    Alcohol abuse Paternal Grandmother    Diabetes Paternal Grandmother    Alcohol abuse Paternal Grandfather       Review of Systems  Constitutional: negative for fatigue and weight loss Respiratory: negative for cough and wheezing Cardiovascular: negative for chest pain, fatigue and palpitations Gastrointestinal: negative for abdominal pain and change in bowel habits Musculoskeletal:negative for myalgias Neurological: negative for gait problems and tremors Behavioral/Psych: negative for abusive relationship, depression Endocrine: negative for temperature intolerance    Genitourinary:positive for vaginal discharge.  negative for abnormal menstrual periods, genital lesions, hot flashes, sexual problems  Integument/breast: negative for breast lump, breast tenderness, nipple discharge and skin lesion(s)    Objective:       BP 124/87   Pulse 82   Wt 294 lb 6.4 oz (133.5 kg)   LMP 11/02/2020   BMI 50.53 kg/m  General:   Alert and no distress  Skin:   no rash or abnormalities  Lungs:   clear to auscultation bilaterally  Heart:   regular rate and rhythm, S1, S2 normal, no murmur, click, rub or gallop  Breasts:   normal without suspicious masses, skin or nipple changes or axillary nodes  Abdomen:  normal findings: no organomegaly, soft, non-tender and no hernia  Pelvis:  External genitalia: normal general appearance Urinary system: urethral meatus normal and bladder without fullness, nontender Vaginal: normal without tenderness, induration or masses Cervix: normal appearance Adnexa: normal bimanual exam Uterus: anteverted and non-tender, normal size   Lab Review Urine pregnancy test Labs reviewed yes Radiologic studies reviewed yes  I have spent a total of 20 minutes of face-to-face time, excluding clinical staff time, reviewing notes and preparing to  see patient, ordering tests and/or medications, and counseling the patient.   Assessment:    1. Encounter for routine gynecological examination with Papanicolaou smear of cervix Rx: - POCT urine pregnancy - Cytology - PAP( [place])  2. Vaginal discharge Rx: - Cervicovaginal ancillary only( [place])  3. Screening for STD (sexually transmitted disease) Rx: - HIV antibody (with reflex) - [facility] - Hepatitis C Antibody - Hepatitis B Surface AntiGEN  4. Intramural leiomyoma of uterus, asymptomatic - will follow clinically  5. Class 3 severe obesity due to excess calories without serious comorbidity with body mass index (BMI) of 50.0 to 59.9 in adult (HCC) - weight reduction with the aid of dietary changes, exercise and behavioral modification recommended      Plan:    Education reviewed: calcium supplements, depression evaluation, low fat, low cholesterol diet, safe sex/STD prevention, self breast exams, and weight bearing exercise. Contraception: abstinence. Follow up in: 1 year.     Jodi Mourning,  Clenton Pare, MD 11/11/2020 1:39 PM

## 2020-11-11 NOTE — Progress Notes (Signed)
Pt is in the office for annual Last pap 06-04-2016 LMP 11-02-20 but pt states cycle only lasted about 2-3 days and consisted of light spotting; cycle is usually 6-7 days. Advised to leave urine sample for UPT.

## 2020-11-12 ENCOUNTER — Other Ambulatory Visit: Payer: Self-pay | Admitting: Obstetrics

## 2020-11-12 DIAGNOSIS — B9689 Other specified bacterial agents as the cause of diseases classified elsewhere: Secondary | ICD-10-CM

## 2020-11-12 DIAGNOSIS — N760 Acute vaginitis: Secondary | ICD-10-CM

## 2020-11-12 DIAGNOSIS — B3731 Acute candidiasis of vulva and vagina: Secondary | ICD-10-CM

## 2020-11-12 DIAGNOSIS — B373 Candidiasis of vulva and vagina: Secondary | ICD-10-CM

## 2020-11-12 LAB — CERVICOVAGINAL ANCILLARY ONLY
Bacterial Vaginitis (gardnerella): POSITIVE — AB
Candida Glabrata: NEGATIVE
Candida Vaginitis: NEGATIVE
Chlamydia: NEGATIVE
Comment: NEGATIVE
Comment: NEGATIVE
Comment: NEGATIVE
Comment: NEGATIVE
Comment: NEGATIVE
Comment: NORMAL
Neisseria Gonorrhea: NEGATIVE
Trichomonas: POSITIVE — AB

## 2020-11-12 MED ORDER — METRONIDAZOLE 500 MG PO TABS: 500.0000 mg | ORAL_TABLET | Freq: Two times a day (BID) | ORAL | 2 refills | Status: DC

## 2020-11-12 MED ORDER — FLUCONAZOLE 150 MG PO TABS: 150.0000 mg | ORAL_TABLET | Freq: Once | ORAL | 0 refills | Status: DC

## 2020-11-12 NOTE — Progress Notes (Signed)
Patient notified of BV and RX for Flagyl. Patient requested Diflucan for history of yeast infection following BV treatment. RX for Diflucan sent by Dr. Jodi Mourning.

## 2020-11-13 LAB — HIV ANTIBODY (ROUTINE TESTING W REFLEX): HIV Screen 4th Generation wRfx: NONREACTIVE

## 2020-11-13 LAB — HEPATITIS B SURFACE ANTIGEN: Hepatitis B Surface Ag: NEGATIVE

## 2020-11-13 LAB — RPR, QUANT+TP ABS (REFLEX)
Rapid Plasma Reagin, Quant: 1:1 {titer} — ABNORMAL HIGH
T Pallidum Abs: NONREACTIVE

## 2020-11-13 LAB — HEPATITIS C ANTIBODY: Hep C Virus Ab: <0.1 s/co ratio (ref 0.0–0.9)

## 2020-11-13 LAB — RPR: RPR Ser Ql: REACTIVE — AB

## 2020-11-13 NOTE — Progress Notes (Signed)
TC to patient to discuss false positive RPR with negative T. Pallidum abs. Patient voices understanding.

## 2020-11-15 LAB — CYTOLOGY - PAP
Comment: NEGATIVE
Diagnosis: UNDETERMINED — AB
High risk HPV: NEGATIVE

## 2021-07-15 ENCOUNTER — Other Ambulatory Visit: Payer: Self-pay | Admitting: Obstetrics

## 2021-07-15 DIAGNOSIS — B3731 Acute candidiasis of vulva and vagina: Secondary | ICD-10-CM

## 2022-06-16 ENCOUNTER — Inpatient Hospital Stay (HOSPITAL_COMMUNITY)
Admission: AD | Admit: 2022-06-16 | Discharge: 2022-06-16 | Disposition: A | Discharge status: Home / Self Care | Payer: Medicaid Other | Attending: Obstetrics and Gynecology | Admitting: Obstetrics and Gynecology

## 2022-06-16 ENCOUNTER — Encounter (HOSPITAL_COMMUNITY): Payer: Self-pay | Admitting: Obstetrics and Gynecology

## 2022-06-16 DIAGNOSIS — N939 Abnormal uterine and vaginal bleeding, unspecified: Secondary | ICD-10-CM | POA: Insufficient documentation

## 2022-06-16 DIAGNOSIS — D259 Leiomyoma of uterus, unspecified: Secondary | ICD-10-CM | POA: Insufficient documentation

## 2022-06-16 DIAGNOSIS — D219 Benign neoplasm of connective and other soft tissue, unspecified: Secondary | ICD-10-CM

## 2022-06-16 LAB — WET PREP, GENITAL
Clue Cells Wet Prep HPF POC: NONE SEEN
Sperm: NONE SEEN
Trich, Wet Prep: NONE SEEN
WBC, Wet Prep HPF POC: <10 (ref <10)
Yeast Wet Prep HPF POC: NONE SEEN

## 2022-06-16 LAB — POCT PREGNANCY, URINE: Preg Test, Ur: NEGATIVE

## 2022-06-16 NOTE — MAU Provider Note (Signed)
History     CSN: [id number]  Arrival date and time: 06/16/22 1446   Event Date/Time   First Provider Initiated Contact with Patient 06/16/22 1518      Chief Complaint  Patient presents with   Vaginal Bleeding   Vaginal Bleeding Pertinent negatives include no abdominal pain, back pain, chills, diarrhea, dysuria, fever, flank pain, nausea, rash, sore throat or vomiting.   Patient is 41 y.o. [id number] here with vaginal bleeding, LMP in Jan with last sex in Feb. UPT here was NEGATIVE. Patient has taken two home UPT that were both negative. She reports a history of fibroids. Has not had recently imaging of fibroids. She reports LLQ pain that is intermittent. She seen [facility] for GYN care but has not been "in sometime"  She is sexually active but last time was in Feb Denies dizziness, lightheadedness. Denies fatigue or palpitations  Past Medical History:  Diagnosis Date   Abscess of Bartholin's gland 01/25/2013   Resolving.   Allergy    Fibroid    Gestational hypertension without significant proteinuria in third trimester 12/29/2014   HSV-2 (herpes simplex virus 2) infection    Hyperemesis gravidarum with electrolyte imbalance 05/28/2014   Infection    UTI   Plantar fasciitis 03/10/2013    Past Surgical History:  Procedure Laterality Date   WISDOM TOOTH EXTRACTION  2000    Family History  Problem Relation Age of Onset   Diabetes Father    Asthma Brother    Diabetes Brother    Early death Brother    Drug abuse Maternal Aunt    Drug abuse Paternal Uncle    Alcohol abuse Maternal Grandmother    Asthma Maternal Grandmother    Alzheimer's disease Maternal Grandmother    Alcohol abuse Maternal Grandfather    Asthma Maternal Grandfather    Alcohol abuse Paternal Grandmother    Diabetes Paternal Grandmother    Alcohol abuse Paternal Grandfather     Social History   Tobacco Use   Smoking status: Never   Smokeless tobacco: Never  Substance Use Topics   Alcohol use: No     Alcohol/week: 0.0 standard drinks of alcohol   Drug use: No    Allergies:  Allergies  Allergen Reactions   Latex Itching    Medications Prior to Admission  Medication Sig Dispense Refill Last Dose   amLODipine (NORVASC) 10 MG tablet Take 0.5 tablets (5 mg total) by mouth daily. (Patient not taking: Reported on 01/04/2017) 30 tablet 3    docusate sodium (COLACE) 100 MG capsule Take 1 capsule (100 mg total) by mouth at bedtime. (Patient not taking: Reported on 01/04/2017) 30 capsule 11    fluconazole (DIFLUCAN) 150 MG tablet TAKE ONE TABLET BY MOUTH AS A ONE-TIME DOSE 1 tablet 0    hydrochlorothiazide (HYDRODIURIL) 25 MG tablet Take 1 tablet (25 mg total) by mouth daily. (Patient not taking: Reported on 01/04/2017) 30 tablet 3    ibuprofen (ADVIL,MOTRIN) 800 MG tablet TAKE 1 TABLET BY MOUTH EVERY 8 HOURS AS NEEDED FOR  MODERATE  PAIN 30 tablet 0    Iron-Folic Acid-B12-C-Docusate (FERRAPLUS 90) 90-1 MG TABS Take 1 tablet by mouth daily. (Patient not taking: Reported on 01/04/2017) 30 tablet 12    labetalol (NORMODYNE) 200 MG tablet Take 2 tablets (400 mg total) by mouth 2 (two) times daily. (Patient not taking: Reported on 01/04/2017) 120 tablet 1    metroNIDAZOLE (FLAGYL) 500 MG tablet Take 1 tablet (500 mg total) by mouth 2 (two) times daily.  14 tablet 2    Oxycodone HCl 10 MG TABS Take 1 tablet (10 mg total) by mouth every 6 (six) hours as needed. (Patient not taking: Reported on 01/04/2017) 40 tablet 0    Prenatal Vit-Fe Fumarate-FA (PRENATAL MULTIVITAMIN) TABS tablet Take 1 tablet by mouth daily at 12 noon.      valACYclovir (VALTREX) 1000 MG tablet Take 1 tablet (1,000 mg total) by mouth 2 (two) times daily. 30 tablet prn     Review of Systems  Constitutional:  Negative for chills and fever.  HENT:  Negative for congestion and sore throat.   Eyes:  Negative for pain and visual disturbance.  Respiratory:  Negative for cough, chest tightness and shortness of breath.   Cardiovascular:   Negative for chest pain.  Gastrointestinal:  Negative for abdominal pain, diarrhea, nausea and vomiting.  Endocrine: Negative for cold intolerance and heat intolerance.  Genitourinary:  Positive for vaginal bleeding. Negative for dysuria and flank pain.  Musculoskeletal:  Negative for back pain.  Skin:  Negative for rash.  Allergic/Immunologic: Negative for food allergies.  Neurological:  Negative for dizziness and light-headedness.  Psychiatric/Behavioral:  Negative for agitation.    Physical Exam   Blood pressure (!) 128/90, pulse 72, temperature 99.6 F (37.6 C), temperature source Oral, resp. rate 15, height 5' 4.5" (1.638 m), weight 108.2 kg, SpO2 100 %.  Physical Exam Vitals and nursing note reviewed.  Constitutional:      General: She is not in acute distress.    Appearance: She is well-developed.  HENT:     Head: Normocephalic and atraumatic.  Eyes:     General: No scleral icterus.    Conjunctiva/sclera: Conjunctivae normal.  Cardiovascular:     Rate and Rhythm: Normal rate.  Pulmonary:     Effort: Pulmonary effort is normal.  Chest:     Chest wall: No tenderness.  Abdominal:     Palpations: Abdomen is soft.     Tenderness: There is abdominal tenderness (mild TTP LLQ, no masses palpated but patient with pannus.). There is no guarding or rebound.     Comments: Obese  Genitourinary:    Vagina: Normal.  Musculoskeletal:        General: Normal range of motion.     Cervical back: Normal range of motion and neck supple.  Skin:    General: Skin is warm and dry.     Findings: No rash.  Neurological:     Mental Status: She is alert and oriented to person, place, and time.     MAU Course  Procedures  MDM- LOW  Results for orders placed or performed during the hospital encounter of 06/16/22 (from the past 24 hour(s))  Pregnancy, urine POC     Status: None   Collection Time: 06/16/22  3:06 PM  Result Value Ref Range   Preg Test, Ur NEGATIVE NEGATIVE  Wet prep,  genital     Status: None   Collection Time: 06/16/22  3:35 PM   Specimen: PATH Cytology Cervicovaginal Ancillary Only  Result Value Ref Range   Yeast Wet Prep HPF POC NONE SEEN NONE SEEN   Trich, Wet Prep NONE SEEN NONE SEEN   Clue Cells Wet Prep HPF POC NONE SEEN NONE SEEN   WBC, Wet Prep HPF POC <10 <10   Sperm NONE SEEN     Assessment and Plan   1. Abnormal uterine bleeding (AUB)   2. Fibroids   - Likely AUB from fibroids - Not pregnant - Wet prep  negative, GC/CT pending - Message sent to [facility] to schedule patient - Outpatient [place] ordered to evaluate fibroids and left adnexa.   Isa Rankin [facility] 06/16/2022, 3:23 PM

## 2022-06-16 NOTE — MAU Note (Signed)
...  Brittney Mcbride is a 42 y.o. at Unknown here in MAU reporting: Irregular periods as well as tender breasts and a "hardening" lower abdomen. Last IC this past February. She reports when she does experiencing vaginal bleeding it lasts for around two days. She reports she does have uterine fibroids and is experiencing left lower sided abdominal pain. Negative UPT in MAU.  Patient reports two negative at home pregnancy tests.   Karyl Kinnier, MD, in triage.   LMP: January - but intermittent spotting since then  Pain score: 7/10 left lower abdomen  Lab orders placed from triage:  POCT Preg

## 2022-06-17 LAB — GC/CHLAMYDIA PROBE AMP (~~LOC~~) NOT AT ARMC
Chlamydia: NEGATIVE
Comment: NEGATIVE
Comment: NORMAL
Neisseria Gonorrhea: NEGATIVE

## 2022-06-25 ENCOUNTER — Ambulatory Visit (HOSPITAL_COMMUNITY)
Admission: RE | Admit: 2022-06-25 | Discharge: 2022-06-25 | Disposition: A | Discharge status: Home / Self Care | Payer: Medicaid Other | Source: Ambulatory Visit | Attending: Family Medicine | Admitting: Family Medicine

## 2022-06-25 DIAGNOSIS — D219 Benign neoplasm of connective and other soft tissue, unspecified: Secondary | ICD-10-CM | POA: Insufficient documentation

## 2022-06-25 DIAGNOSIS — N939 Abnormal uterine and vaginal bleeding, unspecified: Secondary | ICD-10-CM | POA: Diagnosis not present

## 2022-07-14 ENCOUNTER — Ambulatory Visit (INDEPENDENT_AMBULATORY_CARE_PROVIDER_SITE_OTHER): Payer: Medicaid Other | Admitting: Obstetrics & Gynecology

## 2022-07-14 ENCOUNTER — Encounter: Payer: Self-pay | Admitting: Obstetrics & Gynecology

## 2022-07-14 VITALS — BP 121/86 | HR 71 | Ht 64.5 in | Wt 237.0 lb

## 2022-07-14 DIAGNOSIS — N939 Abnormal uterine and vaginal bleeding, unspecified: Secondary | ICD-10-CM | POA: Diagnosis not present

## 2022-07-14 DIAGNOSIS — Z1231 Encounter for screening mammogram for malignant neoplasm of breast: Secondary | ICD-10-CM

## 2022-07-14 DIAGNOSIS — Z113 Encounter for screening for infections with a predominantly sexual mode of transmission: Secondary | ICD-10-CM | POA: Diagnosis not present

## 2022-07-14 NOTE — Progress Notes (Addendum)
42 y.o. GYN presents for ED Follow up and [place] results.  Last PAP 11/11/2020 ASCUS.  Pt needs Mammogram.

## 2022-07-14 NOTE — Progress Notes (Signed)
GYNECOLOGY OFFICE VISIT NOTE  History:   Brittney Mcbride is a 42 y.o. [id number] here today for follow up after MAU visit on 06/16/22.  She was seen for abnormal uterine bleeding (AUB), had a negative UPT, negative pelvic cultures.  Pelvic ultrasound was ordered and she was told to follow up here.  Patient reports that her abnormal bleeding is that she gets regular monthly periods that last 2 days, instead of her usual 6 days. She feels she could be pregnant, wants blood pregnancy test.  Also desires blood STI screen. This has been going on since January 2024. She denies any abnormal vaginal discharge, bleeding, pelvic pain or other concerns.    Past Medical History:  Diagnosis Date   Abscess of Bartholin's gland 01/25/2013   Resolving.   Fibroid    Genital herpes 04/11/2012   Gestational hypertension 12/29/2014   HSV-2 (herpes simplex virus 2) infection    Hyperemesis gravidarum with electrolyte imbalance 05/28/2014   Hypertriglyceridemia 03/10/2013   Morbid obesity (HCC) 04/11/2012   Plantar fasciitis 03/10/2013    Past Surgical History:  Procedure Laterality Date   WISDOM TOOTH EXTRACTION  2000    The following portions of the patient's history were reviewed and updated as appropriate: allergies, current medications, past family history, past medical history, past social history, past surgical history and problem list.   Health Maintenance:  ASCUS pap and negative HRHPV on 11/11/2020.   Never had a mammogram.   Review of Systems:  Pertinent items noted in HPI and remainder of comprehensive ROS otherwise negative.  Physical Exam:  BP 121/86   Pulse 71   Ht 5' 4.5" (1.638 m)   Wt 237 lb (107.5 kg)   BMI 40.05 kg/m  CONSTITUTIONAL: Well-developed, well-nourished female in no acute distress.  HEENT:  Normocephalic, atraumatic. External right and left ear normal. No scleral icterus.  NECK: Normal range of motion, supple, no masses noted on observation SKIN: No rash noted. Not  diaphoretic. No erythema. No pallor. MUSCULOSKELETAL: Normal range of motion. No edema noted. NEUROLOGIC: Alert and oriented to person, place, and time. Normal muscle tone coordination. No cranial nerve deficit noted. PSYCHIATRIC: Normal mood and affect. Normal behavior. Normal judgment and thought content. CARDIOVASCULAR: Normal heart rate noted RESPIRATORY: Effort and breath sounds normal, no problems with respiration noted ABDOMEN: No masses noted. No other overt distention noted.   PELVIC: Deferred  Labs and Imaging US PELVIC COMPLETE WITH TRANSVAGINAL  Result Date: 06/25/2022 CLINICAL DATA:  Dysfunctional uterine bleeding EXAM: TRANSABDOMINAL AND TRANSVAGINAL ULTRASOUND OF PELVIS TECHNIQUE: Both transabdominal and transvaginal ultrasound examinations of the pelvis were performed. Transabdominal technique was performed for global imaging of the pelvis including uterus, ovaries, adnexal regions, and pelvic cul-de-sac. It was necessary to proceed with endovaginal exam following the transabdominal exam to visualize the endometrium and ovaries. COMPARISON:  Pelvic ultrasound August 14, 2010 FINDINGS: Uterus Measurements: 11.8 x 6.7 x 7.5 cm = volume: 307 mL. Two discrete masses are identified. The first is in the anterior fundus measuring 2.1 x 2.4 x 2.2 cm. The second is favored to represent a sub endometrial fibroid measuring 2.2 x 1.2 x 2.2 cm. This probable sub endometrial fibroid correlates with a fibroid in this region on the August 14, 2010 comparison study. This mass measures 1.3 x 1.0 x 1.3 cm at that time. Endometrium Thickness: 5.6 mm.  No focal abnormality visualized. Right ovary Measurements: 2.7 x 2.7 x 2.5 cm = volume: 9.3 mL. Normal appearance/no adnexal mass. Left ovary Measurements:  3.9 x 2.6 x 3.5 cm = volume: 18.6 mL. Normal appearance/no adnexal mass. Other findings No abnormal free fluid. IMPRESSION: 1. There is a mass along the anterior aspect of the endometrium favored to represent a  sub endometrial fibroid demonstrating interval growth compared to the August 14, 2010 study. A mass at base in the endometrium is considered less likely. Given the history of abnormal uterine bleeding, the patient may benefit from history of sonography for confirmation. 2. There is a 2.4 cm fibroid in the uterus. 3. No other abnormalities. Electronically Signed   By: Gerome Sam III M.D.   On: 06/25/2022 18:08      Assessment and Plan:    1. Breast cancer screening by mammogram Mammogram scheduled for patient - MM 3D SCREENING MAMMOGRAM BILATERAL BREAST; Future  2. Routine screening for STI (sexually transmitted infection) STI screen done, will follow up results and manage accordingly. - RPR+HBsAg+HCVAb+HIV  3. Abnormal uterine bleeding (AUB) Ultrasound results reviewed.  Patient has small fibroids, and less monthly bleeding. No intermenstrual bleeding.  Reassured patient that this can occur as perimenopause nears, but will check labs as requested.  No intervention needed for the fibroids at this time. - Beta hCG quant (ref lab) - TSH Rfx on Abnormal to Free T4 - CBC  Routine preventative health maintenance measures emphasized. Please refer to After Visit Summary for other counseling recommendations.   Return for follow up as recommended, any gynecologic concerns.    I spent 30 minutes dedicated to the care of this patient including pre-visit review of records, face to face time with the patient discussing her conditions and treatments and post visit orders.    Jaynie Collins, MD, FACOG Obstetrician & Gynecologist, [facility] for [employer], [facility]

## 2022-07-15 LAB — RPR+HBSAG+HCVAB+...
HIV Screen 4th Generation wRfx: NONREACTIVE
Hep C Virus Ab: NONREACTIVE
Hepatitis B Surface Ag: NEGATIVE
RPR Ser Ql: NONREACTIVE

## 2022-07-15 LAB — TSH RFX ON ABNORMAL TO FREE T4: TSH: 1.56 u[IU]/mL (ref 0.450–4.500)

## 2022-07-15 LAB — CBC
Hematocrit: 33.1 % — ABNORMAL LOW (ref 34.0–46.6)
Hemoglobin: 10 g/dL — ABNORMAL LOW (ref 11.1–15.9)
MCH: 23 pg — ABNORMAL LOW (ref 26.6–33.0)
MCHC: 30.2 g/dL — ABNORMAL LOW (ref 31.5–35.7)
MCV: 76 fL — ABNORMAL LOW (ref 79–97)
Platelets: 265 10*3/uL (ref 150–450)
RBC: 4.34 x10E6/uL (ref 3.77–5.28)
RDW: 15.4 % (ref 11.7–15.4)
WBC: 7 10*3/uL (ref 3.4–10.8)

## 2022-07-15 LAB — BETA HCG QUANT (REF LAB): hCG Quant: <1 m[IU]/mL

## 2022-07-24 ENCOUNTER — Ambulatory Visit (HOSPITAL_BASED_OUTPATIENT_CLINIC_OR_DEPARTMENT_OTHER)
Admission: RE | Admit: 2022-07-24 | Discharge: 2022-07-24 | Disposition: A | Discharge status: Home / Self Care | Payer: Medicaid Other | Source: Ambulatory Visit | Attending: Obstetrics & Gynecology | Admitting: Obstetrics & Gynecology

## 2022-07-24 DIAGNOSIS — Z1231 Encounter for screening mammogram for malignant neoplasm of breast: Secondary | ICD-10-CM | POA: Insufficient documentation

## 2022-07-29 ENCOUNTER — Other Ambulatory Visit: Payer: Self-pay | Admitting: Obstetrics & Gynecology

## 2022-07-29 DIAGNOSIS — R928 Other abnormal and inconclusive findings on diagnostic imaging of breast: Secondary | ICD-10-CM

## 2022-08-10 ENCOUNTER — Other Ambulatory Visit: Payer: Self-pay | Admitting: Obstetrics & Gynecology

## 2022-08-10 ENCOUNTER — Ambulatory Visit
Admission: RE | Admit: 2022-08-10 | Discharge: 2022-08-10 | Disposition: A | Discharge status: Home / Self Care | Payer: Medicaid Other | Source: Ambulatory Visit | Attending: Obstetrics & Gynecology | Admitting: Obstetrics & Gynecology

## 2022-08-10 DIAGNOSIS — R928 Other abnormal and inconclusive findings on diagnostic imaging of breast: Secondary | ICD-10-CM

## 2022-08-10 DIAGNOSIS — N6310 Unspecified lump in the right breast, unspecified quadrant: Secondary | ICD-10-CM

## 2022-08-11 ENCOUNTER — Other Ambulatory Visit: Payer: Self-pay | Admitting: Obstetrics

## 2022-08-11 ENCOUNTER — Ambulatory Visit
Admission: RE | Admit: 2022-08-11 | Discharge: 2022-08-11 | Disposition: A | Discharge status: Home / Self Care | Payer: Medicaid Other | Source: Ambulatory Visit | Attending: Obstetrics & Gynecology | Admitting: Obstetrics & Gynecology

## 2022-08-11 DIAGNOSIS — N946 Dysmenorrhea, unspecified: Secondary | ICD-10-CM

## 2022-08-11 DIAGNOSIS — R928 Other abnormal and inconclusive findings on diagnostic imaging of breast: Secondary | ICD-10-CM

## 2022-08-11 DIAGNOSIS — N6310 Unspecified lump in the right breast, unspecified quadrant: Secondary | ICD-10-CM

## 2022-08-11 HISTORY — PX: BREAST BIOPSY: SHX20

## 2022-08-14 MED ORDER — IBUPROFEN 800 MG PO TABS: 800.0000 mg | ORAL_TABLET | Freq: Three times a day (TID) | ORAL | 5 refills | Status: DC | PRN

## 2022-08-14 MED ORDER — IBUPROFEN 800 MG PO TABS
800.0000 mg | ORAL_TABLET | Freq: Three times a day (TID) | ORAL | 5 refills | Status: DC | PRN
Start: 2022-08-14 — End: 2023-10-18

## 2023-03-09 ENCOUNTER — Ambulatory Visit (INDEPENDENT_AMBULATORY_CARE_PROVIDER_SITE_OTHER): Payer: MEDICAID | Admitting: Obstetrics

## 2023-03-09 ENCOUNTER — Encounter: Payer: Self-pay | Admitting: Obstetrics

## 2023-03-09 ENCOUNTER — Other Ambulatory Visit (HOSPITAL_COMMUNITY)
Admission: RE | Admit: 2023-03-09 | Discharge: 2023-03-09 | Disposition: A | Discharge status: Home / Self Care | Payer: MEDICAID | Source: Ambulatory Visit | Attending: Obstetrics | Admitting: Obstetrics

## 2023-03-09 VITALS — BP 125/86 | HR 62 | Wt 232.2 lb

## 2023-03-09 DIAGNOSIS — N898 Other specified noninflammatory disorders of vagina: Secondary | ICD-10-CM | POA: Insufficient documentation

## 2023-03-09 DIAGNOSIS — E669 Obesity, unspecified: Secondary | ICD-10-CM

## 2023-03-09 MED ORDER — METRONIDAZOLE 500 MG PO TABS
500.0000 mg | ORAL_TABLET | Freq: Two times a day (BID) | ORAL | 2 refills | Status: AC
Start: 2023-03-09 — End: ?

## 2023-03-09 MED ORDER — FLUCONAZOLE 200 MG PO TABS: 200.0000 mg | ORAL_TABLET | ORAL | 2 refills | Status: DC

## 2023-03-09 NOTE — Progress Notes (Signed)
 Patient ID: Brittney Mcbride, female   DOB: 12/19/1980, 43 y.o.   MRN: [id number]  Chief Complaint  Patient presents with   Vaginal Itching    HPI Brittney Mcbride is a 43 y.o. female.  Complains of vaginal discharge and itching. HPI  Past Medical History:  Diagnosis Date   Abscess of Bartholin's gland 01/25/2013   Resolving.   Fibroid    Genital herpes 04/11/2012   Gestational hypertension 12/29/2014   HSV-2 (herpes simplex virus 2) infection    Hyperemesis gravidarum with electrolyte imbalance 05/28/2014   Hypertriglyceridemia 03/10/2013   Morbid obesity (HCC) 04/11/2012   Plantar fasciitis 03/10/2013    Past Surgical History:  Procedure Laterality Date   BREAST BIOPSY Right 08/11/2022   US  RT BREAST BX W LOC DEV 1ST LESION IMG BX SPEC US  GUIDE 08/11/2022 [facility] MAMMOGRAPHY   WISDOM TOOTH EXTRACTION  2000    Family History  Problem Relation Age of Onset   Diabetes Father    Asthma Brother    Diabetes Brother    Early death Brother    Drug abuse Maternal Aunt    Drug abuse Paternal Uncle    Alcohol abuse Maternal Grandmother    Asthma Maternal Grandmother    Alzheimer's disease Maternal Grandmother    Alcohol abuse Maternal Grandfather    Asthma Maternal Grandfather    Alcohol abuse Paternal Grandmother    Diabetes Paternal Grandmother    Alcohol abuse Paternal Grandfather     Social History Social History   Tobacco Use   Smoking status: Never   Smokeless tobacco: Never  Substance Use Topics   Alcohol use: No    Alcohol/week: 0.0 standard drinks of alcohol   Drug use: No    Allergies  Allergen Reactions   Latex Itching    Current Outpatient Medications  Medication Sig Dispense Refill   fluconazole  (DIFLUCAN ) 200 MG tablet Take 1 tablet (200 mg total) by mouth every 3 (three) days. 3 tablet 2   ibuprofen  (ADVIL ) 800 MG tablet Take 1 tablet (800 mg total) by mouth every 8 (eight) hours as needed. 30 tablet 5   ibuprofen  (ADVIL ) 800 MG tablet Take 1 tablet  (800 mg total) by mouth every 8 (eight) hours as needed. 30 tablet 5   metroNIDAZOLE  (FLAGYL ) 500 MG tablet Take 1 tablet (500 mg total) by mouth 2 (two) times daily. 14 tablet 2   valACYclovir  (VALTREX ) 1000 MG tablet Take 1 tablet (1,000 mg total) by mouth 2 (two) times daily. (Patient not taking: Reported on 03/09/2023) 30 tablet prn   No current facility-administered medications for this visit.    Review of Systems Review of Systems Constitutional: negative for fatigue and weight loss Respiratory: negative for cough and wheezing Cardiovascular: negative for chest pain, fatigue and palpitations Gastrointestinal: negative for abdominal pain and change in bowel habits Genitourinary:  positive for vaginal discharge and itching Integument/breast: negative for nipple discharge Musculoskeletal:negative for myalgias Neurological: negative for gait problems and tremors Behavioral/Psych: negative for abusive relationship, depression Endocrine: negative for temperature intolerance      Blood pressure 125/86, pulse 62, weight 232 lb 3.2 oz (105.3 kg), last menstrual period 02/22/2023.  Physical Exam Physical Exam General:   Alert and no distress  Skin:   no rash or abnormalities  Lungs:   clear to auscultation bilaterally  Heart:   regular rate and rhythm, S1, S2 normal, no murmur, click, rub or gallop  Breasts:   normal without suspicious masses, skin or nipple changes or  axillary nodes  Abdomen:  normal findings: no organomegaly, soft, non-tender and no hernia  Pelvis:  External genitalia: normal general appearance Urinary system: urethral meatus normal and bladder without fullness, nontender Vaginal: normal without tenderness, induration or masses Cervix: normal appearance Adnexa: normal bimanual exam Uterus: anteverted and non-tender, normal size    I have spent a total of 20 minutes of face-to-face time, excluding clinical staff time, reviewing notes and preparing to see patient,  ordering tests and/or medications, and counseling the patient.   Data Reviewed Wet Prep  Assessment     1. Vaginal discharge (Primary) Rx: - metroNIDAZOLE  (FLAGYL ) 500 MG tablet; Take 1 tablet (500 mg total) by mouth 2 (two) times daily.  Dispense: 14 tablet; Refill: 2  2. Vaginal itching Rx - Cervicovaginal ancillary only( [facility]) - fluconazole  (DIFLUCAN ) 200 MG tablet; Take 1 tablet (200 mg total) by mouth every 3 (three) days.  Dispense: 3 tablet; Refill: 2  3. Obesity (BMI 35.0-39.9 without comorbidity) - weight reduction with the aid of dietary changes, exercise and behavioral modification recommended     Plan   Follow up in 3 months  Meds ordered this encounter  Medications   fluconazole  (DIFLUCAN ) 200 MG tablet    Sig: Take 1 tablet (200 mg total) by mouth every 3 (three) days.    Dispense:  3 tablet    Refill:  2   metroNIDAZOLE  (FLAGYL ) 500 MG tablet    Sig: Take 1 tablet (500 mg total) by mouth 2 (two) times daily.    Dispense:  14 tablet    Refill:  2    CARLIN RONAL CENTERS, MD, FACOG Attending Obstetrician & Gynecologist, [facility] for [facility], [facility], [place] 03/09/2023

## 2023-03-09 NOTE — Progress Notes (Signed)
 Pt presents for vaginal itching. Has been going on for about a month, comes after cycle. Reports itching only on outside of vagina as well as around buttocks.

## 2023-03-10 ENCOUNTER — Other Ambulatory Visit: Payer: Self-pay | Admitting: Family Medicine

## 2023-03-10 LAB — CERVICOVAGINAL ANCILLARY ONLY
Bacterial Vaginitis (gardnerella): POSITIVE — AB
Candida Glabrata: NEGATIVE
Candida Vaginitis: NEGATIVE
Chlamydia: NEGATIVE
Comment: NEGATIVE
Comment: NEGATIVE
Comment: NEGATIVE
Comment: NEGATIVE
Comment: NEGATIVE
Comment: NORMAL
Neisseria Gonorrhea: NEGATIVE
Trichomonas: NEGATIVE

## 2023-08-30 ENCOUNTER — Other Ambulatory Visit: Payer: Self-pay | Admitting: Obstetrics

## 2023-08-30 DIAGNOSIS — Z1231 Encounter for screening mammogram for malignant neoplasm of breast: Secondary | ICD-10-CM

## 2023-09-07 ENCOUNTER — Ambulatory Visit
Admission: RE | Admit: 2023-09-07 | Discharge: 2023-09-07 | Disposition: A | Discharge status: Home / Self Care | Payer: MEDICAID | Source: Ambulatory Visit

## 2023-09-07 DIAGNOSIS — Z1231 Encounter for screening mammogram for malignant neoplasm of breast: Secondary | ICD-10-CM

## 2023-09-10 ENCOUNTER — Ambulatory Visit: Payer: Self-pay | Admitting: Family Medicine

## 2023-10-17 ENCOUNTER — Other Ambulatory Visit: Payer: Self-pay | Admitting: Obstetrics

## 2023-10-17 DIAGNOSIS — N946 Dysmenorrhea, unspecified: Secondary | ICD-10-CM

## 2024-03-20 ENCOUNTER — Ambulatory Visit (INDEPENDENT_AMBULATORY_CARE_PROVIDER_SITE_OTHER): Payer: MEDICAID | Admitting: Obstetrics

## 2024-03-20 ENCOUNTER — Other Ambulatory Visit (HOSPITAL_COMMUNITY)
Admission: RE | Admit: 2024-03-20 | Discharge: 2024-03-20 | Disposition: A | Payer: MEDICAID | Source: Ambulatory Visit | Attending: Obstetrics | Admitting: Obstetrics

## 2024-03-20 ENCOUNTER — Encounter: Payer: Self-pay | Admitting: Obstetrics

## 2024-03-20 VITALS — BP 132/89 | HR 62 | Ht 64.0 in | Wt 253.3 lb

## 2024-03-20 DIAGNOSIS — G8929 Other chronic pain: Secondary | ICD-10-CM | POA: Diagnosis not present

## 2024-03-20 DIAGNOSIS — N898 Other specified noninflammatory disorders of vagina: Secondary | ICD-10-CM | POA: Diagnosis not present

## 2024-03-20 DIAGNOSIS — M549 Dorsalgia, unspecified: Secondary | ICD-10-CM | POA: Diagnosis not present

## 2024-03-20 DIAGNOSIS — D251 Intramural leiomyoma of uterus: Secondary | ICD-10-CM

## 2024-03-20 DIAGNOSIS — Z01419 Encounter for gynecological examination (general) (routine) without abnormal findings: Secondary | ICD-10-CM

## 2024-03-20 DIAGNOSIS — N946 Dysmenorrhea, unspecified: Secondary | ICD-10-CM

## 2024-03-20 DIAGNOSIS — M25511 Pain in right shoulder: Secondary | ICD-10-CM

## 2024-03-20 DIAGNOSIS — E569 Vitamin deficiency, unspecified: Secondary | ICD-10-CM

## 2024-03-20 DIAGNOSIS — Z6841 Body Mass Index (BMI) 40.0 and over, adult: Secondary | ICD-10-CM | POA: Diagnosis not present

## 2024-03-20 DIAGNOSIS — Z3009 Encounter for other general counseling and advice on contraception: Secondary | ICD-10-CM

## 2024-03-20 DIAGNOSIS — N62 Hypertrophy of breast: Secondary | ICD-10-CM | POA: Diagnosis not present

## 2024-03-20 DIAGNOSIS — E66813 Obesity, class 3: Secondary | ICD-10-CM | POA: Diagnosis not present

## 2024-03-20 MED ORDER — IBUPROFEN 800 MG PO TABS: 800.0000 mg | ORAL_TABLET | Freq: Three times a day (TID) | ORAL | 5 refills | Status: AC | PRN

## 2024-03-20 MED ORDER — FLUCONAZOLE 200 MG PO TABS: 200.0000 mg | ORAL_TABLET | ORAL | 2 refills | Status: AC

## 2024-03-20 MED ORDER — METRONIDAZOLE 500 MG PO TABS: 500.0000 mg | ORAL_TABLET | Freq: Two times a day (BID) | ORAL | 4 refills | Status: AC

## 2024-03-20 MED ORDER — VITAFOL ULTRA 29-0.6-0.4-200 MG PO CAPS: 1.0000 | ORAL_CAPSULE | Freq: Every day | ORAL | 4 refills | Status: AC

## 2024-03-20 NOTE — Progress Notes (Signed)
 "  Subjective:        Brittney Mcbride is a 44 y.o. female here for a routine exam.  Current complaints: Shoulder pain and back pain from from large and heavy breasts, worsening over the past few years.  Has tried several well supported bras without relief.  Has also lost weight..  Also c/o chronic BV and yeast infections.  Personal health questionnaire:  Is patient Ashkenazi Jewish, have a family history of breast and/or ovarian cancer: no Is there a family history of uterine cancer diagnosed at age < 64, gastrointestinal cancer, urinary tract cancer, family member who is a Personnel Officer syndrome-associated carrier: no Is the patient overweight and hypertensive, family history of diabetes, personal history of gestational diabetes, preeclampsia or PCOS: no Is patient over 39, have PCOS,  family history of premature CHD under age 55, diabetes, smoke, have hypertension or peripheral artery disease:  no At any time, has a partner hit, kicked or otherwise hurt or frightened you?: no Over the past 2 weeks, have you felt down, depressed or hopeless?: no Over the past 2 weeks, have you felt little interest or pleasure in doing things?:no   Gynecologic History Patient's last menstrual period was 03/07/2024 (exact date). Contraception: condoms Last Pap: 2022. Results were: abnormal Last mammogram: 2025. Results were: normal  Obstetric History OB History  Gravida Para Term Preterm AB Living  2 2 2  0 0 2  SAB IAB Ectopic Multiple Live Births  0 0 0 0 2    # Outcome Date GA Lbr Len/2nd Weight Sex Type Anes PTL Lv  2 Term 12/30/14 [redacted]w[redacted]d 03:18 / 00:24 8 lb 4.4 oz (3.754 kg) M Vag-Spont EPI  LIV  1 Term 2009    M Vag-Spont EPI N LIV    Past Medical History:  Diagnosis Date   Abscess of Bartholin's gland 01/25/2013   Resolving.   Fibroid    Genital herpes 04/11/2012   Gestational hypertension 12/29/2014   HSV-2 (herpes simplex virus 2) infection    Hyperemesis gravidarum with electrolyte imbalance  05/28/2014   Hypertriglyceridemia 03/10/2013   Morbid obesity (HCC) 04/11/2012   Plantar fasciitis 03/10/2013    Past Surgical History:  Procedure Laterality Date   BREAST BIOPSY Right 08/11/2022   US  RT BREAST BX W LOC DEV 1ST LESION IMG BX SPEC US  GUIDE 08/11/2022 [facility]MO[facility]   WISDOM TOOTH EXTRACTION  03/02/1998    Current Medications[1] Allergies[2]  Social History   Tobacco Use   Smoking status: Never   Smokeless tobacco: Never  Substance Use Topics   Alcohol use: No    Alcohol/week: 0.0 standard drinks of alcohol    Family History  Problem Relation Age of Onset   Diabetes Father    Drug abuse Maternal Aunt    Drug abuse Paternal Uncle    Alcohol abuse Maternal Grandmother    Asthma Maternal Grandmother    Alzheimer's disease Maternal Grandmother    Alcohol abuse Maternal Grandfather    Asthma Maternal Grandfather    Alcohol abuse Paternal Grandmother    Diabetes Paternal Grandmother    Alcohol abuse Paternal Grandfather    Asthma Brother    Diabetes Brother    Early death Brother    Breast cancer Neg Hx    BRCA 1/2 Neg Hx       Review of Systems  Constitutional: negative for fatigue and weight loss Respiratory: negative for cough and wheezing Cardiovascular: negative for chest pain, fatigue and palpitations Gastrointestinal: negative for abdominal pain and  change in bowel habits Musculoskeletal:positive for myalgias ( Shoulder pain and upper back pain ) Neurological: negative for gait problems and tremors Behavioral/Psych: negative for abusive relationship, depression Endocrine: negative for temperature intolerance    Genitourinary: positive for vaginal discharge.  negative for abnormal menstrual periods, genital lesions, hot flashes, sexual problems  Integument/breast: negative for breast lump, breast tenderness, nipple discharge and skin lesion(s)    Objective:       BP 132/89   Pulse 62   Ht 5' 4 (1.626 m)   Wt 253 lb 4.8 oz (114.9 kg)    LMP 03/07/2024 (Exact Date)   BMI 43.48 kg/m  General:   Alert and no distress  Skin:   no rash or abnormalities  Lungs:   clear to auscultation bilaterally  Heart:   regular rate and rhythm, S1, S2 normal, no murmur, click, rub or gallop  Breasts:   normal without suspicious masses, skin or nipple changes or axillary nodes  Abdomen:  normal findings: no organomegaly, soft, non-tender and no hernia  Pelvis:  External genitalia: normal general appearance Urinary system: urethral meatus normal and bladder without fullness, nontender Vaginal: normal without tenderness, induration or masses Cervix: normal appearance Adnexa: normal bimanual exam Uterus: anteverted and non-tender, normal size   Lab Review Urine pregnancy test Labs reviewed yes Radiologic studies reviewed yes  I have spent a total of 20 minutes of face-to-face time, excluding clinical staff time, reviewing notes and preparing to see patient, ordering tests and/or medications, and counseling the patient.   Assessment:    1. Encounter for routine gynecological examination with Papanicolaou smear of cervix (Primary) Rx: - Cytology - PAP( [facility])  2. Vaginal discharge Rx: - metroNIDAZOLE  (FLAGYL ) 500 MG tablet; Take 1 tablet (500 mg total) by mouth 2 (two) times daily.  Dispense: 14 tablet; Refill: 4  3. Intramural leiomyoma of uterus - clinically stable  4. Encounter for other general counseling and advice on contraception - options discussed.  Wants condoms.  5. Class 3 severe obesity due to excess calories without serious comorbidity with body mass index (BMI) of 40.0 to 44.9 in adult (HCC) - weight reduction with the aid of dietary changes, exercise and behavioral modification recommended  6. Dysmenorrhea Rx: - ibuprofen  (ADVIL ) 800 MG tablet; Take 1 tablet (800 mg total) by mouth every 8 (eight) hours as needed.  Dispense: 30 tablet; Refill: 5  7. Vaginal itching Rx: - fluconazole  (DIFLUCAN ) 200 MG  tablet; Take 1 tablet (200 mg total) by mouth every 3 (three) days.  Dispense: 3 tablet; Refill: 2  8. Large breasts with subsequent worsening shoulder and back pain - has tried many support bras without success - discussed the possible need for breast reduction.  Patient information dispensed.  9. Chronic pain of both shoulders  10. Upper back pain  11. Vitamin deficiency Rx: - Prenat-Fe Poly-Methfol-FA-DHA (VITAFOL  ULTRA) 29-0.6-0.4-200 MG CAPS; Take 1 capsule by mouth daily before breakfast.  Dispense: 90 capsule; Refill: 4     Plan:    Education reviewed: calcium  supplements, depression evaluation, low fat, low cholesterol diet, safe sex/STD prevention, self breast exams, and weight bearing exercise. Contraception: condoms. Follow up in: 1 year.   Meds ordered this encounter  Medications   ibuprofen  (ADVIL ) 800 MG tablet    Sig: Take 1 tablet (800 mg total) by mouth every 8 (eight) hours as needed.    Dispense:  30 tablet    Refill:  5    Please consider 90 day supplies  to promote better adherence   metroNIDAZOLE  (FLAGYL ) 500 MG tablet    Sig: Take 1 tablet (500 mg total) by mouth 2 (two) times daily.    Dispense:  14 tablet    Refill:  4   fluconazole  (DIFLUCAN ) 200 MG tablet    Sig: Take 1 tablet (200 mg total) by mouth every 3 (three) days.    Dispense:  3 tablet    Refill:  2   Prenat-Fe Poly-Methfol-FA-DHA (VITAFOL  ULTRA) 29-0.6-0.4-200 MG CAPS    Sig: Take 1 capsule by mouth daily before breakfast.    Dispense:  90 capsule    Refill:  4    CARLIN RONAL CENTERS, MD, FACOG Attending Obstetrician & Gynecologist, [facility] [employer], [facility], [facility] 03/20/2024     [1]  Current Outpatient Medications:    ibuprofen  (ADVIL ) 800 MG tablet, TAKE 1 TABLET BY MOUTH EVERY 8 HOURS AS NEEDED, Disp: 30 tablet, Rfl: 0   metroNIDAZOLE  (FLAGYL ) 500 MG tablet, Take 1 tablet (500 mg total) by mouth 2 (two) times daily., Disp: 14  tablet, Rfl: 4   Prenat-Fe Poly-Methfol-FA-DHA (VITAFOL  ULTRA) 29-0.6-0.4-200 MG CAPS, Take 1 capsule by mouth daily before breakfast., Disp: 90 capsule, Rfl: 4   fluconazole  (DIFLUCAN ) 200 MG tablet, Take 1 tablet (200 mg total) by mouth every 3 (three) days., Disp: 3 tablet, Rfl: 2   ibuprofen  (ADVIL ) 800 MG tablet, Take 1 tablet (800 mg total) by mouth every 8 (eight) hours as needed., Disp: 30 tablet, Rfl: 5   metroNIDAZOLE  (FLAGYL ) 500 MG tablet, Take 1 tablet (500 mg total) by mouth 2 (two) times daily. (Patient not taking: Reported on 03/20/2024), Disp: 14 tablet, Rfl: 2   valACYclovir  (VALTREX ) 1000 MG tablet, Take 1 tablet (1,000 mg total) by mouth 2 (two) times daily. (Patient not taking: Reported on 03/20/2024), Disp: 30 tablet, Rfl: prn [2]  Allergies Allergen Reactions   Latex Itching   "

## 2024-03-20 NOTE — Progress Notes (Signed)
 Pt presents for annual. Pt has no questions or concerns at this time.pt has mam on 7/11

## 2024-03-21 ENCOUNTER — Other Ambulatory Visit (HOSPITAL_COMMUNITY)
Admission: RE | Admit: 2024-03-21 | Discharge: 2024-03-21 | Disposition: A | Discharge status: Home / Self Care | Payer: MEDICAID | Source: Ambulatory Visit | Attending: Obstetrics | Admitting: Obstetrics

## 2024-03-21 DIAGNOSIS — N898 Other specified noninflammatory disorders of vagina: Secondary | ICD-10-CM | POA: Diagnosis present

## 2024-03-21 LAB — CERVICOVAGINAL ANCILLARY ONLY
Bacterial Vaginitis (gardnerella): NEGATIVE
Candida Glabrata: NEGATIVE
Candida Vaginitis: NEGATIVE
Chlamydia: NEGATIVE
Comment: NEGATIVE
Comment: NEGATIVE
Comment: NEGATIVE
Comment: NEGATIVE
Comment: NEGATIVE
Comment: NORMAL
Neisseria Gonorrhea: NEGATIVE
Trichomonas: NEGATIVE

## 2024-03-21 NOTE — Addendum Note (Signed)
 Addended by: MARCINE GAINS on: 03/21/2024 08:55 AM   Modules accepted: Orders

## 2024-03-22 LAB — CYTOLOGY - PAP
Adequacy: ABSENT
Comment: NEGATIVE
Diagnosis: NEGATIVE
High risk HPV: NEGATIVE
